# Patient Record
Sex: Female | Born: 1945 | Race: Black or African American | Hispanic: No | Smoking: Former smoker
Health system: Southern US, Community
[De-identification: ages and names within clinical notes are randomized; demographics above are authoritative.]

## PROBLEM LIST (undated history)

## (undated) DIAGNOSIS — H269 Unspecified cataract: Secondary | ICD-10-CM

## (undated) DIAGNOSIS — Z9189 Other specified personal risk factors, not elsewhere classified: Secondary | ICD-10-CM

## (undated) DIAGNOSIS — M1990 Unspecified osteoarthritis, unspecified site: Secondary | ICD-10-CM

## (undated) DIAGNOSIS — E785 Hyperlipidemia, unspecified: Secondary | ICD-10-CM

## (undated) HISTORY — DX: Hyperlipidemia, unspecified: E78.5

## (undated) HISTORY — DX: Unspecified osteoarthritis, unspecified site: M19.90

## (undated) HISTORY — DX: Unspecified cataract: H26.9

## (undated) HISTORY — DX: Other specified personal risk factors, not elsewhere classified: Z91.89

---

## 2008-08-23 HISTORY — PX: COLONOSCOPY: SHX174

## 2017-09-02 ENCOUNTER — Encounter: Payer: Self-pay | Admitting: Nurse Practitioner

## 2017-09-02 ENCOUNTER — Ambulatory Visit (INDEPENDENT_AMBULATORY_CARE_PROVIDER_SITE_OTHER): Payer: Medicare Other | Admitting: Nurse Practitioner

## 2017-09-02 VITALS — BP 160/96 | HR 55 | Temp 97.9°F | Ht 63.0 in | Wt 170.0 lb

## 2017-09-02 DIAGNOSIS — H9193 Unspecified hearing loss, bilateral: Secondary | ICD-10-CM

## 2017-09-02 DIAGNOSIS — R079 Chest pain, unspecified: Secondary | ICD-10-CM

## 2017-09-02 DIAGNOSIS — R030 Elevated blood-pressure reading, without diagnosis of hypertension: Secondary | ICD-10-CM | POA: Diagnosis not present

## 2017-09-02 DIAGNOSIS — R12 Heartburn: Secondary | ICD-10-CM | POA: Diagnosis not present

## 2017-09-02 LAB — CBC WITH DIFFERENTIAL/PLATELET
BASOS ABS: 0 10*3/uL (ref 0.0–0.1)
Basophils Relative: 0.4 % (ref 0.0–3.0)
Eosinophils Absolute: 0.1 10*3/uL (ref 0.0–0.7)
Eosinophils Relative: 0.9 % (ref 0.0–5.0)
HCT: 42.6 % (ref 36.0–46.0)
Hemoglobin: 13.3 g/dL (ref 12.0–15.0)
LYMPHS ABS: 2.2 10*3/uL (ref 0.7–4.0)
LYMPHS PCT: 35.1 % (ref 12.0–46.0)
MCHC: 31.3 g/dL (ref 30.0–36.0)
MCV: 87.6 fl (ref 78.0–100.0)
MONOS PCT: 8.4 % (ref 3.0–12.0)
Monocytes Absolute: 0.5 10*3/uL (ref 0.1–1.0)
NEUTROS PCT: 55.2 % (ref 43.0–77.0)
Neutro Abs: 3.5 10*3/uL (ref 1.4–7.7)
Platelets: 226 10*3/uL (ref 150.0–400.0)
RBC: 4.86 Mil/uL (ref 3.87–5.11)
RDW: 13.4 % (ref 11.5–15.5)
WBC: 6.3 10*3/uL (ref 4.0–10.5)

## 2017-09-02 LAB — LIPID PANEL
Cholesterol: 206 mg/dL — ABNORMAL HIGH (ref 0–200)
HDL: 53 mg/dL (ref >39.00)
LDL Cholesterol: 132 mg/dL — ABNORMAL HIGH (ref 0–99)
NONHDL: 152.83
Total CHOL/HDL Ratio: 4
Triglycerides: 103 mg/dL (ref 0.0–149.0)
VLDL: 20.6 mg/dL (ref 0.0–40.0)

## 2017-09-02 LAB — COMPREHENSIVE METABOLIC PANEL
ALK PHOS: 72 U/L (ref 39–117)
ALT: 13 U/L (ref 0–35)
AST: 15 U/L (ref 0–37)
Albumin: 4.4 g/dL (ref 3.5–5.2)
BILIRUBIN TOTAL: 0.5 mg/dL (ref 0.2–1.2)
BUN: 12 mg/dL (ref 6–23)
CO2: 29 mEq/L (ref 19–32)
Calcium: 9.5 mg/dL (ref 8.4–10.5)
Chloride: 102 mEq/L (ref 96–112)
Creatinine, Ser: 0.66 mg/dL (ref 0.40–1.20)
GFR: 113.14 mL/min (ref >60.00)
GLUCOSE: 91 mg/dL (ref 70–99)
Potassium: 4.5 mEq/L (ref 3.5–5.1)
Sodium: 140 mEq/L (ref 135–145)
TOTAL PROTEIN: 7.1 g/dL (ref 6.0–8.3)

## 2017-09-02 MED ORDER — FAMOTIDINE 20 MG PO TABS: 20.0000 mg | ORAL_TABLET | Freq: Two times a day (BID) | ORAL | Status: DC

## 2017-09-02 NOTE — Progress Notes (Signed)
Subjective:  Patient ID: Heather Winters, female    DOB: 10/09/1945  Age: 72 y.o. MRN: [id number]  CC: Chest Pain   Chest Pain   This is a chronic problem. The current episode started more than 1 month ago. The onset quality is gradual. The problem occurs intermittently. The problem has been waxing and waning. The pain is present in the substernal region. The quality of the pain is described as dull and pressure. The pain does not radiate. Pertinent negatives include no abdominal pain, cough, diaphoresis, dizziness, exertional chest pressure, fever, headaches, irregular heartbeat, leg pain, lower extremity edema, nausea, numbness, orthopnea, palpitations, shortness of breath or weakness. Risk factors include being elderly, sedentary lifestyle, smoking/tobacco exposure and post-menopausal.  Pertinent negatives for past medical history include no muscle weakness.  Gastroesophageal Reflux  She complains of chest pain and heartburn. She reports no abdominal pain, no choking, no coughing, no dysphagia, no globus sensation, no hoarse voice, no nausea, no sore throat, no stridor or no wheezing. This is a chronic problem. The current episode started more than 1 month ago. The problem occurs frequently. The problem has been unchanged. The heartburn is located in the substernum. The heartburn does not wake her from sleep. The heartburn does not limit her activity. The heartburn doesn't change with position. Nothing aggravates the symptoms. Pertinent negatives include no anemia, fatigue, melena, muscle weakness, orthopnea or weight loss. She has tried nothing for the symptoms.   No outpatient medications prior to visit.   No facility-administered medications prior to visit.    Family History  Problem Relation Age of Onset  . Diabetes Mother   . Diabetes Brother   . Diabetes Brother   . Diabetes Brother    Social History   Socioeconomic History  . Marital status: Widowed    Spouse name: Not on file  .  Number of children: Not on file  . Years of education: Not on file  . Highest education level: Not on file  Social Needs  . Financial resource strain: Not on file  . Food insecurity - worry: Not on file  . Food insecurity - inability: Not on file  . Transportation needs - medical: Not on file  . Transportation needs - non-medical: Not on file  Occupational History  . Not on file  Tobacco Use  . Smoking status: Former Research scientist (life sciences)  . Smokeless tobacco: Never Used  Substance and Sexual Activity  . Alcohol use: No    Frequency: Never  . Drug use: No  . Sexual activity: Not on file  Other Topics Concern  . Not on file  Social History Narrative  . Not on file   ROS See HPI  Objective:  BP (!) 160/96   Pulse (!) 55   Temp 97.9 F (36.6 C) (Oral)   Ht 5\' 3"  (1.6 m)   Wt 170 lb (77.1 kg)   SpO2 98%   BMI 30.11 kg/m   BP Readings from Last 3 Encounters:  09/02/17 (!) 160/96    Wt Readings from Last 3 Encounters:  09/02/17 170 lb (77.1 kg)    Physical Exam  Constitutional: She is oriented to person, place, and time. No distress.  Cardiovascular: Normal rate and regular rhythm.  Pulmonary/Chest: Effort normal and breath sounds normal.  Abdominal: Soft. Bowel sounds are normal.  Neurological: She is alert and oriented to person, place, and time.  Vitals reviewed.   Lab Results  Component Value Date   WBC 6.3 09/02/2017   HGB  13.3 09/02/2017   HCT 42.6 09/02/2017   PLT 226.0 09/02/2017   GLUCOSE 91 09/02/2017   CHOL 206 (H) 09/02/2017   TRIG 103.0 09/02/2017   HDL 53.00 09/02/2017   LDLCALC 132 (H) 09/02/2017   ALT 13 09/02/2017   AST 15 09/02/2017   NA 140 09/02/2017   K 4.5 09/02/2017   CL 102 09/02/2017   CREATININE 0.66 09/02/2017   BUN 12 09/02/2017   CO2 29 09/02/2017    ECG: sinus bradycardia, non specific T wave abnormality.  Assessment & Plan:   Okema was seen today for chest pain.  Diagnoses and all orders for this visit:  Chest pain,  unspecified type -     EKG 12-Lead -     CBC w/Diff -     Comprehensive metabolic panel -     Lipid panel  Bilateral hearing loss, unspecified hearing loss type -     Ambulatory referral to Audiology  Heartburn -     famotidine (PEPCID) 20 MG tablet; Take 1 tablet (20 mg total) by mouth 2 (two) times daily.  Elevated BP without diagnosis of hypertension -     EKG 12-Lead -     CBC w/Diff -     Comprehensive metabolic panel -     Lipid panel   I am having Chanay Traughber start on famotidine.  Meds ordered this encounter  Medications  . famotidine (PEPCID) 20 MG tablet    Sig: Take 1 tablet (20 mg total) by mouth 2 (two) times daily.    Order Specific Question:   Supervising Provider    Answer:   Lucille Passy [[id number]]    Follow-up: Return in about 2 weeks (around 09/16/2017) for GERD and elevated BP.  Wilfred Lacy, NP

## 2017-09-02 NOTE — Patient Instructions (Addendum)
No acute finding on ECG.  Check BP at home three times a week and record.  You will be called to make appt with audiology.  Stable labs F/up in 2week as discussed. Entered referral to cardiology.  If you have medicare related insurance (such as traditional Medicare, Blue [employer], [employer], or similar), Please make an appointment at the scheduling desk with the [facility], for your Wellness visit in this office, which is a benefit with your insurance.  Need to sign medical release to get records from previous pcp in [place].   Food Choices for Gastroesophageal Reflux Disease, Adult When you have gastroesophageal reflux disease (GERD), the foods you eat and your eating habits are very important. Choosing the right foods can help ease your discomfort. What guidelines do I need to follow?  Choose fruits, vegetables, whole grains, and low-fat dairy products.  Choose low-fat meat, fish, and poultry.  Limit fats such as oils, salad dressings, butter, nuts, and avocado.  Keep a food diary. This helps you identify foods that cause symptoms.  Avoid foods that cause symptoms. These may be different for everyone.  Eat small meals often instead of 3 large meals a day.  Eat your meals slowly, in a place where you are relaxed.  Limit fried foods.  Cook foods using methods other than frying.  Avoid drinking alcohol.  Avoid drinking large amounts of liquids with your meals.  Avoid bending over or lying down until 2-3 hours after eating. What foods are not recommended? These are some foods and drinks that may make your symptoms worse: Vegetables Tomatoes. Tomato juice. Tomato and spaghetti sauce. Chili peppers. Onion and garlic. Horseradish. Fruits Oranges, grapefruit, and lemon (fruit and juice). Meats High-fat meats, fish, and poultry. This includes hot dogs, ribs, ham, sausage, salami, and bacon. Dairy Whole milk and chocolate milk. Sour cream.  Cream. Butter. Ice cream. Cream cheese. Drinks Coffee and tea. Bubbly (carbonated) drinks or energy drinks. Condiments Hot sauce. Barbecue sauce. Sweets/Desserts Chocolate and cocoa. Donuts. Peppermint and spearmint. Fats and Oils High-fat foods. This includes [place] fries and potato chips. Other Vinegar. Strong spices. This includes black pepper, white pepper, red pepper, cayenne, curry powder, cloves, ginger, and chili powder. The items listed above may not be a complete list of foods and drinks to avoid. Contact your dietitian for more information. This information is not intended to replace advice given to you by your health care provider. Make sure you discuss any questions you have with your health care provider. Document Released: 02/08/2012 Document Revised: 01/15/2016 Document Reviewed: 06/13/2013 Elsevier Interactive Patient Education  2017 [employer].

## 2017-09-16 ENCOUNTER — Encounter: Payer: Self-pay | Admitting: Nurse Practitioner

## 2017-09-16 ENCOUNTER — Ambulatory Visit (INDEPENDENT_AMBULATORY_CARE_PROVIDER_SITE_OTHER): Payer: Medicare Other | Admitting: Nurse Practitioner

## 2017-09-16 VITALS — BP 130/84 | HR 60 | Temp 97.8°F | Ht 63.0 in | Wt 170.0 lb

## 2017-09-16 DIAGNOSIS — R030 Elevated blood-pressure reading, without diagnosis of hypertension: Secondary | ICD-10-CM

## 2017-09-16 DIAGNOSIS — K219 Gastro-esophageal reflux disease without esophagitis: Secondary | ICD-10-CM

## 2017-09-16 HISTORY — DX: Elevated blood-pressure reading, without diagnosis of hypertension: R03.0

## 2017-09-16 MED ORDER — FAMOTIDINE 20 MG PO TABS: 20.0000 mg | ORAL_TABLET | Freq: Every day | ORAL | Status: DC | PRN

## 2017-09-16 NOTE — Patient Instructions (Addendum)
Continue check BP once or twice a week. Return to office if BP is persistently >150/80.  Mainatain upcoming appt with cardiology 10/14/2017.  If you have medicare related insurance (such as traditional Medicare, [employer], [employer], or similar), Please make an appointment at the scheduling desk with the [facility], for your Wellness visit in this office, which is a benefit with your insurance.  DASH Eating Plan DASH stands for "Dietary Approaches to Stop Hypertension." The DASH eating plan is a healthy eating plan that has been shown to reduce high blood pressure (hypertension). It may also reduce your risk for type 2 diabetes, heart disease, and stroke. The DASH eating plan may also help with weight loss. What are tips for following this plan? General guidelines  Avoid eating more than 2,300 mg (milligrams) of salt (sodium) a day. If you have hypertension, you may need to reduce your sodium intake to 1,500 mg a day.  Limit alcohol intake to no more than 1 drink a day for nonpregnant women and 2 drinks a day for men. One drink equals 12 oz of beer, 5 oz of wine, or 1 oz of hard liquor.  Work with your health care provider to maintain a healthy body weight or to lose weight. Ask what an ideal weight is for you.  Get at least 30 minutes of exercise that causes your heart to beat faster (aerobic exercise) most days of the week. Activities may include walking, swimming, or biking.  Work with your health care provider or diet and nutrition specialist (dietitian) to adjust your eating plan to your individual calorie needs. Reading food labels  Check food labels for the amount of sodium per serving. Choose foods with less than 5 percent of the Daily Value of sodium. Generally, foods with less than 300 mg of sodium per serving fit into this eating plan.  To find whole grains, look for the word "whole" as the first word in the ingredient list. Shopping  Buy  products labeled as "low-sodium" or "no salt added."  Buy fresh foods. Avoid canned foods and premade or frozen meals. Cooking  Avoid adding salt when cooking. Use salt-free seasonings or herbs instead of table salt or sea salt. Check with your health care provider or pharmacist before using salt substitutes.  Do not fry foods. Cook foods using healthy methods such as baking, boiling, grilling, and broiling instead.  Cook with heart-healthy oils, such as olive, canola, soybean, or sunflower oil. Meal planning   Eat a balanced diet that includes: ? 5 or more servings of fruits and vegetables each day. At each meal, try to fill half of your plate with fruits and vegetables. ? Up to 6-8 servings of whole grains each day. ? Less than 6 oz of lean meat, poultry, or fish each day. A 3-oz serving of meat is about the same size as a deck of cards. One egg equals 1 oz. ? 2 servings of low-fat dairy each day. ? A serving of nuts, seeds, or beans 5 times each week. ? Heart-healthy fats. Healthy fats called Omega-3 fatty acids are found in foods such as flaxseeds and coldwater fish, like sardines, salmon, and mackerel.  Limit how much you eat of the following: ? Canned or prepackaged foods. ? Food that is high in trans fat, such as fried foods. ? Food that is high in saturated fat, such as fatty meat. ? Sweets, desserts, sugary drinks, and other foods with added sugar. ? Full-fat dairy  products.  Do not salt foods before eating.  Try to eat at least 2 vegetarian meals each week.  Eat more home-cooked food and less restaurant, buffet, and fast food.  When eating at a restaurant, ask that your food be prepared with less salt or no salt, if possible. What foods are recommended? The items listed may not be a complete list. Talk with your dietitian about what dietary choices are best for you. Grains Whole-grain or whole-wheat bread. Whole-grain or whole-wheat pasta. Brown rice. [name].  Bulgur. Whole-grain and low-sodium cereals. Pita bread. Low-fat, low-sodium crackers. Whole-wheat flour tortillas. Vegetables Fresh or frozen vegetables (raw, steamed, roasted, or grilled). Low-sodium or reduced-sodium tomato and vegetable juice. Low-sodium or reduced-sodium tomato sauce and tomato paste. Low-sodium or reduced-sodium canned vegetables. Fruits All fresh, dried, or frozen fruit. Canned fruit in natural juice (without added sugar). Meat and other protein foods Skinless chicken or [place]. Ground chicken or [place]. Pork with fat trimmed off. Fish and seafood. Egg whites. Dried beans, peas, or lentils. Unsalted nuts, nut butters, and seeds. Unsalted canned beans. Lean cuts of beef with fat trimmed off. Low-sodium, lean deli meat. Dairy Low-fat (1%) or fat-free (skim) milk. Fat-free, low-fat, or reduced-fat cheeses. Nonfat, low-sodium ricotta or cottage cheese. Low-fat or nonfat yogurt. Low-fat, low-sodium cheese. Fats and oils Soft margarine without trans fats. Vegetable oil. Low-fat, reduced-fat, or light mayonnaise and salad dressings (reduced-sodium). Canola, safflower, olive, soybean, and sunflower oils. Avocado. Seasoning and other foods Herbs. Spices. Seasoning mixes without salt. Unsalted popcorn and pretzels. Fat-free sweets. What foods are not recommended? The items listed may not be a complete list. Talk with your dietitian about what dietary choices are best for you. Grains Baked goods made with fat, such as croissants, muffins, or some breads. Dry pasta or rice meal packs. Vegetables Creamed or fried vegetables. Vegetables in a cheese sauce. Regular canned vegetables (not low-sodium or reduced-sodium). Regular canned tomato sauce and paste (not low-sodium or reduced-sodium). Regular tomato and vegetable juice (not low-sodium or reduced-sodium). Heather Winters. Olives. Fruits Canned fruit in a light or heavy syrup. Fried fruit. Fruit in cream or butter sauce. Meat and other  protein foods Fatty cuts of meat. Ribs. Fried meat. Heather Winters. Sausage. Bologna and other processed lunch meats. Salami. Fatback. Hotdogs. Bratwurst. Salted nuts and seeds. Canned beans with added salt. Canned or smoked fish. Whole eggs or egg yolks. Chicken or [place] with skin. Dairy Whole or 2% milk, cream, and half-and-half. Whole or full-fat cream cheese. Whole-fat or sweetened yogurt. Full-fat cheese. Nondairy creamers. Whipped toppings. Processed cheese and cheese spreads. Fats and oils Butter. Stick margarine. Lard. Shortening. Ghee. Bacon fat. Tropical oils, such as coconut, palm kernel, or palm oil. Seasoning and other foods Salted popcorn and pretzels. Onion salt, garlic salt, seasoned salt, table salt, and sea salt. Worcestershire sauce. Tartar sauce. Barbecue sauce. Teriyaki sauce. Soy sauce, including reduced-sodium. Steak sauce. Canned and packaged gravies. Fish sauce. Oyster sauce. Cocktail sauce. Horseradish that you find on the shelf. Ketchup. Mustard. Meat flavorings and tenderizers. Bouillon cubes. Hot sauce and Tabasco sauce. Premade or packaged marinades. Premade or packaged taco seasonings. Relishes. Regular salad dressings. Where to find more information:  National Heart, Lung, and [place]: https://wilson-eaton.com/  American Heart Association: www.heart.org Summary  The DASH eating plan is a healthy eating plan that has been shown to reduce high blood pressure (hypertension). It may also reduce your risk for type 2 diabetes, heart disease, and stroke.  With the DASH eating plan, you should limit  salt (sodium) intake to 2,300 mg a day. If you have hypertension, you may need to reduce your sodium intake to 1,500 mg a day.  When on the DASH eating plan, aim to eat more fresh fruits and vegetables, whole grains, lean proteins, low-fat dairy, and heart-healthy fats.  Work with your health care provider or diet and nutrition specialist (dietitian) to adjust your eating plan to your  individual calorie needs. This information is not intended to replace advice given to you by your health care provider. Make sure you discuss any questions you have with your health care provider. Document Released: 07/29/2011 Document Revised: 08/02/2016 Document Reviewed: 08/02/2016 Elsevier Interactive Patient Education  [employer].

## 2017-09-16 NOTE — Progress Notes (Signed)
   Subjective:  Patient ID: Heather Winters, female    DOB: 03/06/1946  Age: 72 y.o. MRN: [id number]  CC: Follow-up (2 wk GERD and BP--GERD is getting better)   HPI  GERD: Improved heartburn and indigestion. Use of pepcid prn at this time. Has not made changes to diet.  Elevated BP: Home readings of 130s/80s. Has persistent intermittent CP. Not related to exertion.  Outpatient Medications Prior to Visit  Medication Sig Dispense Refill  . famotidine (PEPCID) 20 MG tablet Take 1 tablet (20 mg total) by mouth 2 (two) times daily.     No facility-administered medications prior to visit.     ROS See HPI  Objective:  BP 130/84   Pulse 60   Temp 97.8 F (36.6 C)   Ht 5\' 3"  (1.6 m)   Wt 170 lb (77.1 kg)   SpO2 98%   BMI 30.11 kg/m   BP Readings from Last 3 Encounters:  09/16/17 130/84  09/02/17 (!) 160/96    Wt Readings from Last 3 Encounters:  09/16/17 170 lb (77.1 kg)  09/02/17 170 lb (77.1 kg)    Physical Exam  Constitutional: She is oriented to person, place, and time. No distress.  Cardiovascular: Normal rate and regular rhythm.  Pulmonary/Chest: Effort normal and breath sounds normal. She exhibits no tenderness.  Abdominal: Soft. Bowel sounds are normal.  Musculoskeletal: She exhibits no edema.  Neurological: She is alert and oriented to person, place, and time.  Vitals reviewed.   Lab Results  Component Value Date   WBC 6.3 09/02/2017   HGB 13.3 09/02/2017   HCT 42.6 09/02/2017   PLT 226.0 09/02/2017   GLUCOSE 91 09/02/2017   CHOL 206 (H) 09/02/2017   TRIG 103.0 09/02/2017   HDL 53.00 09/02/2017   LDLCALC 132 (H) 09/02/2017   ALT 13 09/02/2017   AST 15 09/02/2017   NA 140 09/02/2017   K 4.5 09/02/2017   CL 102 09/02/2017   CREATININE 0.66 09/02/2017   BUN 12 09/02/2017   CO2 29 09/02/2017     Assessment & Plan:   Heather Winters was seen today for follow-up.  Diagnoses and all orders for this visit:  Gastroesophageal reflux disease without  esophagitis -     famotidine (PEPCID) 20 MG tablet; Take 1 tablet (20 mg total) by mouth daily as needed for heartburn or indigestion.  Elevated BP without diagnosis of hypertension   I have changed Heather Winters's famotidine.  Meds ordered this encounter  Medications  . famotidine (PEPCID) 20 MG tablet    Sig: Take 1 tablet (20 mg total) by mouth daily as needed for heartburn or indigestion.    Order Specific Question:   Supervising Provider    Answer:   Lucille Passy [[id number]]    Follow-up: Return in about 1 year (around 09/16/2018) for elevated BP and GERD.Marland Kitchen  Wilfred Lacy, NP

## 2017-09-26 DIAGNOSIS — H903 Sensorineural hearing loss, bilateral: Secondary | ICD-10-CM | POA: Diagnosis not present

## 2017-10-11 ENCOUNTER — Encounter: Payer: Self-pay | Admitting: Nurse Practitioner

## 2017-10-11 DIAGNOSIS — H903 Sensorineural hearing loss, bilateral: Secondary | ICD-10-CM | POA: Insufficient documentation

## 2017-10-13 NOTE — Progress Notes (Signed)
New Outpatient Visit Date: 10/14/2017  Referring Provider: Flossie Buffy, NP [place], [place] [address]  Chief Complaint: Chest discomfort  HPI:  Heather Winters is a 72 y.o. female who is being seen today for the evaluation of chest pain at the request of Heather Winters. She has a history of arthritis.  She was evaluated twice by her PCP last month due to chest pain and heartburn.  She was started on famotidine 20 mg BID with improvement in heartburn.  However, she continued to have intermittent chest pain, per her PCP.  Today, Heather Winters reports feeling relatively well. She initially became "consciousness" of her heart a few years ago, feeling it beating in her chest. It was not racing or irregular. Beginning a few months ago, she started having a "funny feeling" in her chest. On one occasion, she had a burning and fullness in the center of her chest that lasted 1.5 days. Since starting famotidine, this has resolved. There were no associated symptoms other than chronic dizziness. Heather Winters is unable to characterize the severity of the discomfort, nor does she report any exacerbating factors. She has occasional dependent edema one seated for long periods of time. She denies orthopnea and PND.  Heather Winters reports an episode in the 71s during which her heart began racing. She was evaluated in some sort of a medical facility and received an IV medication with prompt termination of the palpitations. Up until a few years ago, she still had episodes of fluttering in her chest. This Winters stopped smoking in January and feels as though her chest discomfort has improved.  --------------------------------------------------------------------------------------------------  Cardiovascular History & Procedures: Cardiovascular Problems:  Chest pain  Risk Factors:  Tobacco use, age > 4, and obesity  Cath/PCI:  None  CV Surgery:  None  EP Procedures and  Devices:  None  Non-Invasive Evaluation(s):  None  Recent CV Pertinent Labs: Lab Results  Component Value Date   CHOL 206 (H) 09/02/2017   HDL 53.00 09/02/2017   LDLCALC 132 (H) 09/02/2017   TRIG 103.0 09/02/2017   CHOLHDL 4 09/02/2017   K 4.5 09/02/2017   BUN 12 09/02/2017   CREATININE 0.66 09/02/2017    --------------------------------------------------------------------------------------------------  Past Medical History:  Diagnosis Date  . Arthritis   . History of fainting spells of unknown cause     History reviewed. No pertinent surgical history.  Current Meds  Medication Sig  . famotidine (PEPCID) 20 MG tablet Take 1 tablet (20 mg total) by mouth daily as needed for heartburn or indigestion.    Allergies: Patient has no known allergies.  Social History   Socioeconomic History  . Marital status: Widowed    Spouse name: Not on file  . Number of children: Not on file  . Years of education: Not on file  . Highest education level: Not on file  Social Needs  . Financial resource strain: Not on file  . Food insecurity - worry: Not on file  . Food insecurity - inability: Not on file  . Transportation needs - medical: Not on file  . Transportation needs - non-medical: Not on file  Occupational History  . Not on file  Tobacco Use  . Smoking status: Former Research scientist (life sciences)  . Smokeless tobacco: Never Used  Substance and Sexual Activity  . Alcohol use: No    Frequency: Never  . Drug use: No  . Sexual activity: Not on file  Other Topics Concern  . Not on file  Social  History Narrative  . Not on file    Family History  Problem Relation Age of Onset  . Diabetes Mother   . Diabetes Brother   . Diabetes Brother   . Diabetes Brother     Review of Systems: Review of Systems  Constitutional: Positive for diaphoresis.  HENT: Negative.   Eyes: Negative.   Respiratory: Negative.   Cardiovascular: Positive for chest pain and leg swelling.  Gastrointestinal:  Negative.   Genitourinary: Negative.   Musculoskeletal: Positive for myalgias.  Skin: Negative.   Neurological: Positive for dizziness.  Endo/Heme/Allergies: Negative.   Psychiatric/Behavioral: Positive for depression (Question). The patient is nervous/anxious (Question).     --------------------------------------------------------------------------------------------------  Physical Exam: BP 138/70   Pulse (!) 58   Ht 5\' 3"  (1.6 m)   Wt 168 lb 1.9 oz (76.3 kg)   SpO2 99%   BMI 29.78 kg/m   General:  Overweight woman, seated comfortably in the exam room. HEENT: No conjunctival pallor or scleral icterus. Moist mucous membranes. OP clear. Neck: Supple without lymphadenopathy, thyromegaly, JVD, or HJR. No carotid bruit. Lungs: Normal work of breathing. Clear to auscultation bilaterally without wheezes or crackles. Heart: Regular rate and rhythm with 2/6 systolic murmur loudest at the right upper sternal border. No rubs or gallops. Nondisplaced PMI.  Abd: Bowel sounds present. Soft, NT/ND without hepatosplenomegaly Ext: No lower extremity edema. Radial, PT, and DP pulses are 2+ bilaterally Skin: Warm and dry without rash. Neuro: CNIII-XII intact. Strength and fine-touch sensation intact in upper and lower extremities bilaterally. Psych: Normal mood and affect.  EKG:  Sinus bradycardia (heart rate 58 bpm), right atrial enlargement, poor R-wave progression in V1 and V2, and inferolateral T-wave inversions.  Lab Results  Component Value Date   WBC 6.3 09/02/2017   HGB 13.3 09/02/2017   HCT 42.6 09/02/2017   MCV 87.6 09/02/2017   PLT 226.0 09/02/2017    Lab Results  Component Value Date   NA 140 09/02/2017   K 4.5 09/02/2017   CL 102 09/02/2017   CO2 29 09/02/2017   BUN 12 09/02/2017   CREATININE 0.66 09/02/2017   GLUCOSE 91 09/02/2017   ALT 13 09/02/2017    Lab Results  Component Value Date   CHOL 206 (H) 09/02/2017   HDL 53.00 09/02/2017   LDLCALC 132 (H) 09/02/2017    TRIG 103.0 09/02/2017   CHOLHDL 4 09/02/2017    --------------------------------------------------------------------------------------------------  ASSESSMENT AND PLAN: Atypical chest pain and abnormal EKG Chest pain is most suggestive of GI pathology, including GERD. However, EKG is abnormal with poor R-wave progression and anterolateral T-wave inversions. Given that Heather Winters has been asymptomatic for over a month now, I do not feel that urgent evaluation is needed. Cardiac risk factors include age, obsity, and history of tobacco use. I recommended that we begin aspirin 81 mg daily and obtain an exercise myocardial perfusion stress test. If pain recurs, I have advised her to seek immediate medical attention.  Heart murmur Incidentally noted on exam today. No signs or symptoms of heart failure. We will obtain an echocardiogram for further assessment.  GERD Continue famotidine.  Follow-up: Return to clinic in 4-6 weeks.  Nelva Bush, MD 10/14/2017 10:02 AM

## 2017-10-14 ENCOUNTER — Ambulatory Visit (INDEPENDENT_AMBULATORY_CARE_PROVIDER_SITE_OTHER): Payer: Medicare Other | Admitting: Internal Medicine

## 2017-10-14 ENCOUNTER — Encounter: Payer: Self-pay | Admitting: Internal Medicine

## 2017-10-14 VITALS — BP 138/70 | HR 58 | Ht 63.0 in | Wt 168.1 lb

## 2017-10-14 DIAGNOSIS — R0789 Other chest pain: Secondary | ICD-10-CM | POA: Diagnosis not present

## 2017-10-14 DIAGNOSIS — R9431 Abnormal electrocardiogram [ECG] [EKG]: Secondary | ICD-10-CM

## 2017-10-14 DIAGNOSIS — R011 Cardiac murmur, unspecified: Secondary | ICD-10-CM | POA: Insufficient documentation

## 2017-10-14 HISTORY — DX: Abnormal electrocardiogram (ECG) (EKG): R94.31

## 2017-10-14 HISTORY — DX: Other chest pain: R07.89

## 2017-10-14 NOTE — Patient Instructions (Addendum)
Medication Instructions:   START Aspirin by mouth 81 mg  -- If you need a refill on your cardiac medications before your next appointment, please call your pharmacy. --  Labwork: None ordered  Testing/Procedures: Your physician has requested that you have en exercise stress myoview. For further information please visit [email or web address]. Please follow instruction sheet, as given.  Your physician has requested that you have an echocardiogram. Echocardiography is a painless test that uses sound waves to create images of your heart. It provides your doctor with information about the size and shape of your heart and how well your heart's chambers and valves are working. This procedure takes approximately one hour. There are no restrictions for this procedure.   Schedule next available  Follow-Up: Your physician wants you to follow-up in: 4-6 weeks with Dr. Saunders Revel or APP  You will receive a reminder letter in the mail two months in advance. If you don't receive a letter, please call our office to schedule the follow-up appointment.  Thank you for choosing [facility]!!    Any Other Special Instructions Will Be Listed Below (If Applicable).

## 2017-10-20 ENCOUNTER — Telehealth (HOSPITAL_COMMUNITY): Payer: Self-pay | Admitting: *Deleted

## 2017-10-20 NOTE — Telephone Encounter (Signed)
Patient given detailed instructions per Myocardial Perfusion Study Information Sheet for the test on 10/25/17. Patient notified to arrive 15 minutes early and that it is imperative to arrive on time for appointment to keep from having the test rescheduled.  If you need to cancel or reschedule your appointment, please call the office within 24 hours of your appointment. . Patient verbalized understanding.Heather Winters, Heather Winters    

## 2017-10-25 ENCOUNTER — Other Ambulatory Visit: Payer: Self-pay

## 2017-10-25 ENCOUNTER — Ambulatory Visit (HOSPITAL_COMMUNITY): Payer: Medicare Other | Attending: Cardiology

## 2017-10-25 ENCOUNTER — Ambulatory Visit (HOSPITAL_BASED_OUTPATIENT_CLINIC_OR_DEPARTMENT_OTHER): Payer: Medicare Other

## 2017-10-25 DIAGNOSIS — Z87891 Personal history of nicotine dependence: Secondary | ICD-10-CM | POA: Diagnosis not present

## 2017-10-25 DIAGNOSIS — R011 Cardiac murmur, unspecified: Secondary | ICD-10-CM | POA: Diagnosis not present

## 2017-10-25 DIAGNOSIS — I340 Nonrheumatic mitral (valve) insufficiency: Secondary | ICD-10-CM | POA: Insufficient documentation

## 2017-10-25 DIAGNOSIS — R0789 Other chest pain: Secondary | ICD-10-CM | POA: Insufficient documentation

## 2017-10-25 DIAGNOSIS — R002 Palpitations: Secondary | ICD-10-CM | POA: Diagnosis not present

## 2017-10-25 DIAGNOSIS — I2510 Atherosclerotic heart disease of native coronary artery without angina pectoris: Secondary | ICD-10-CM | POA: Diagnosis not present

## 2017-10-25 DIAGNOSIS — I517 Cardiomegaly: Secondary | ICD-10-CM | POA: Insufficient documentation

## 2017-10-25 DIAGNOSIS — R9431 Abnormal electrocardiogram [ECG] [EKG]: Secondary | ICD-10-CM | POA: Diagnosis not present

## 2017-10-25 MED ORDER — TECHNETIUM TC 99M TETROFOSMIN IV KIT
32.6000 | PACK | Freq: Once | INTRAVENOUS | Status: AC | PRN
  Administered 2017-10-25: 32.6 via INTRAVENOUS
  Filled 2017-10-25: qty 33

## 2017-10-25 MED ORDER — TECHNETIUM TC 99M TETROFOSMIN IV KIT
10.2000 | PACK | Freq: Once | INTRAVENOUS | Status: AC | PRN
  Administered 2017-10-25: 10.2 via INTRAVENOUS
  Filled 2017-10-25: qty 11

## 2017-10-26 LAB — MYOCARDIAL PERFUSION IMAGING
CHL CUP MPHR: 148 {beats}/min
CHL CUP NUCLEAR SDS: 2
CHL CUP NUCLEAR SRS: 6
Estimated workload: 7 METS
Exercise duration (min): 6 min
LV dias vol: 62 mL (ref 46–106)
LVSYSVOL: 20 mL
NUC STRESS TID: 1.11
Peak HR: 130 {beats}/min
Percent HR: 88 %
RATE: 0.21
RPE: 19
Rest HR: 53 {beats}/min
SSS: 5

## 2017-11-04 ENCOUNTER — Encounter: Payer: Self-pay | Admitting: *Deleted

## 2017-11-14 ENCOUNTER — Ambulatory Visit: Payer: Medicare Other | Admitting: Internal Medicine

## 2018-02-20 NOTE — Progress Notes (Signed)
Subjective:   Heather Winters is a 72 y.o. female who presents for an Initial Medicare Annual Wellness Visit.  Review of Systems   No ROS.  Medicare Wellness Visit. Additional risk factors are reflected in the social history.    Sleep patterns: Sleep patterns vary. Home Safety/Smoke Alarms: Feels safe in home. Smoke alarms in place.  Living environment; residence and Firearm Safety: Lives in apt.  Female:       Mammo- ordered      Dexa scan-  ordered      [facility]- pt will check on date of last screening.     Objective:    Today's Vitals   02/22/18 0848  BP: 132/84  Pulse: (!) 54  SpO2: 98%  Weight: 168 lb 6.4 oz (76.4 kg)  Height: 5\' 3"  (1.6 m)   Body mass index is 29.83 kg/m.  Advanced Directives 02/22/2018  Does Patient Have a Medical Advance Directive? No  Would patient like information on creating a medical advance directive? No - Patient declined    Current Medications (verified) Outpatient Encounter Medications as of 02/22/2018  Medication Sig  . famotidine (PEPCID) 20 MG tablet Take 1 tablet (20 mg total) by mouth daily as needed for heartburn or indigestion.   No facility-administered encounter medications on file as of 02/22/2018.     Allergies (verified) Patient has no known allergies.   History: Past Medical History:  Diagnosis Date  . Arthritis   . History of fainting spells of unknown cause    History reviewed. No pertinent surgical history. Family History  Problem Relation Age of Onset  . Diabetes Mother   . Diabetes Brother   . Diabetes Brother   . Diabetes Brother   . COPD Father   . Heart disease Neg Hx    Social History   Socioeconomic History  . Marital status: Widowed    Spouse name: Not on file  . Number of children: Not on file  . Years of education: Not on file  . Highest education level: Not on file  Occupational History  . Not on file  Social Needs  . Financial resource strain: Not on file  . Food insecurity:    Worry: Not on  file    Inability: Not on file  . Transportation needs:    Medical: Not on file    Non-medical: Not on file  Tobacco Use  . Smoking status: Former Smoker    Packs/day: 0.50    Years: 35.00    Pack years: 17.50    Types: Cigarettes  . Smokeless tobacco: Never Used  Substance and Sexual Activity  . Alcohol use: No    Frequency: Never  . Drug use: No  . Sexual activity: Not Currently  Lifestyle  . Physical activity:    Days per week: Not on file    Minutes per session: Not on file  . Stress: Not on file  Relationships  . Social connections:    Talks on phone: Not on file    Gets together: Not on file    Attends religious service: Not on file    Active member of club or organization: Not on file    Attends meetings of clubs or organizations: Not on file    Relationship status: Not on file  Other Topics Concern  . Not on file  Social History Narrative  . Not on file    Tobacco Counseling Counseling given: Not Answered   Clinical Intake: Pain : No/denies pain  Activities of Daily Living In your present state of health, do you have any difficulty performing the following activities: 02/22/2018  Hearing? Y  Comment pt reports hearing test earlier this year. was told hearing aids not needed at this time.  Vision? N  Difficulty concentrating or making decisions? N  Walking or climbing stairs? N  Dressing or bathing? N  Doing errands, shopping? N  Preparing Food and eating ? N  Using the Toilet? N  In the past six months, have you accidently leaked urine? N  Do you have problems with loss of bowel control? N  Managing your Medications? N  Managing your Finances? N  Housekeeping or managing your Housekeeping? N     Immunizations and Health Maintenance  There is no immunization history on file for this patient. Health Maintenance Due  Topic Date Due  . Hepatitis C Screening  09/27/1945  . TETANUS/TDAP  06/02/1964  . MAMMOGRAM  06/03/1995  . COLONOSCOPY   06/03/1995  . DEXA SCAN  06/02/2010  . PNA vac Low Risk Adult (1 of 2 - PCV13) 06/02/2010    Patient Care Team: Nche, Charlene Brooke, NP as PCP - General (Internal Medicine) End, Harrell Gave, MD as PCP - Cardiology (Cardiology)  Indicate any recent Medical Services you may have received from other than [facility] providers in the past year (date may be approximate).     Assessment:   This is a routine wellness examination for [facility]. Physical assessment deferred to PCP.  Hearing/Vision screen No exam data present  Dietary issues and exercise activities discussed: Current Exercise Habits: The patient does not participate in regular exercise at present, Exercise limited by: None identified Diet (meal preparation, eat out, water intake, caffeinated beverages, dairy products, fruits and vegetables): 24 hr recall Breakfast:cereal Lunch: skips Dinner:   Catfish and coleslaw Pt reports she will start drinking more water.  Goals    . Increase physical activity      Depression Screen PHQ 2/9 Scores 02/22/2018 09/02/2017  PHQ - 2 Score 1 6    Fall Risk Fall Risk  02/22/2018 09/02/2017  Falls in the past year? No No    Cognitive Function: Ad8 score reviewed for issues:  Issues making decisions:no  Less interest in hobbies / activities:no  Repeats questions, stories (family complaining):no  Trouble using ordinary gadgets (microwave, computer, phone):no  Forgets the month or year: no  Mismanaging finances: no  Remembering appts:no  Daily problems with thinking and/or memory:no Ad8 score is=0        Screening Tests Health Maintenance  Topic Date Due  . Hepatitis C Screening  1946-05-06  . TETANUS/TDAP  06/02/1964  . MAMMOGRAM  06/03/1995  . COLONOSCOPY  06/03/1995  . DEXA SCAN  06/02/2010  . PNA vac Low Risk Adult (1 of 2 - PCV13) 06/02/2010  . INFLUENZA VACCINE  05/05/2018 (Originally 03/23/2018)    Plan:    Please schedule your next medicare wellness visit with me in 1  yr.  Continue to eat heart healthy diet (full of fruits, vegetables, whole grains, lean protein, water--limit salt, fat, and sugar intake) and increase physical activity as tolerated.  Continue doing brain stimulating activities (puzzles, reading, adult coloring books, staying active) to keep memory sharp.   I have ordered your mammogram and bone density scan. Please schedule.  Please notify us of colonoscopy report/ date.  I have personally reviewed and noted the following in the patient's chart:   . Medical and social history . Use of alcohol, tobacco or  illicit drugs  . Current medications and supplements . Functional ability and status . Nutritional status . Physical activity . Advanced directives . List of other physicians . Hospitalizations, surgeries, and ER visits in previous 12 months . Vitals . Screenings to include cognitive, depression, and falls . Referrals and appointments  In addition, I have reviewed and discussed with patient certain preventive protocols, quality metrics, and best practice recommendations. A written personalized care plan for preventive services as well as general preventive health recommendations were provided to patient.     Shela Nevin, [place]   02/22/2018

## 2018-02-22 ENCOUNTER — Ambulatory Visit (INDEPENDENT_AMBULATORY_CARE_PROVIDER_SITE_OTHER): Payer: Medicare Other | Admitting: Behavioral Health

## 2018-02-22 ENCOUNTER — Encounter: Payer: Self-pay | Admitting: Behavioral Health

## 2018-02-22 VITALS — BP 132/84 | HR 54 | Ht 63.0 in | Wt 168.4 lb

## 2018-02-22 DIAGNOSIS — Z0000 Encounter for general adult medical examination without abnormal findings: Secondary | ICD-10-CM

## 2018-02-22 DIAGNOSIS — Z1239 Encounter for other screening for malignant neoplasm of breast: Secondary | ICD-10-CM

## 2018-02-22 DIAGNOSIS — Z780 Asymptomatic menopausal state: Secondary | ICD-10-CM

## 2018-02-22 NOTE — Patient Instructions (Addendum)
Please schedule your next medicare wellness visit with me in 1 yr.  Continue to eat heart healthy diet (full of fruits, vegetables, whole grains, lean protein, water--limit salt, fat, and sugar intake) and increase physical activity as tolerated.  Continue doing brain stimulating activities (puzzles, reading, adult coloring books, staying active) to keep memory sharp.   I have ordered your mammogram and bone density scan. Please schedule.  Please notify us of colonoscopy report/ date.  Heather Winters , Thank you for taking time to come for your Medicare Wellness Visit. I appreciate your ongoing commitment to your health goals. Please review the following plan we discussed and let me know if I can assist you in the future.   These are the goals we discussed: Goals    . Increase physical activity       This is a list of the screening recommended for you and due dates:  Health Maintenance  Topic Date Due  .  Hepatitis C: One time screening is recommended by Center for Disease Control  (CDC) for  adults born from [birth year] through 1966.   1945-09-10  . Tetanus Vaccine  06/02/1964  . Mammogram  06/03/1995  . Colon Cancer Screening  06/03/1995  . DEXA scan (bone density measurement)  06/02/2010  . Pneumonia vaccines (1 of 2 - PCV13) 06/02/2010  . Flu Shot  05/05/2018*  *Topic was postponed. The date shown is not the original due date.    Health Maintenance for Postmenopausal Women Menopause is a normal process in which your reproductive ability comes to an end. This process happens gradually over a span of months to years, usually between the ages of 60 and 85. Menopause is complete when you have missed 12 consecutive menstrual periods. It is important to talk with your health care provider about some of the most common conditions that affect postmenopausal women, such as heart disease, cancer, and bone loss (osteoporosis). Adopting a healthy lifestyle and getting preventive care can help to  promote your health and wellness. Those actions can also lower your chances of developing some of these common conditions. What should I know about menopause? During menopause, you may experience a number of symptoms, such as:  Moderate-to-severe hot flashes.  Night sweats.  Decrease in sex drive.  Mood swings.  Headaches.  Tiredness.  Irritability.  Memory problems.  Insomnia.  Choosing to treat or not to treat menopausal changes is an individual decision that you make with your health care provider. What should I know about hormone replacement therapy and supplements? Hormone therapy products are effective for treating symptoms that are associated with menopause, such as hot flashes and night sweats. Hormone replacement carries certain risks, especially as you become older. If you are thinking about using estrogen or estrogen with progestin treatments, discuss the benefits and risks with your health care provider. What should I know about heart disease and stroke? Heart disease, heart attack, and stroke become more likely as you age. This may be due, in part, to the hormonal changes that your body experiences during menopause. These can affect how your body processes dietary fats, triglycerides, and cholesterol. Heart attack and stroke are both medical emergencies. There are many things that you can do to help prevent heart disease and stroke:  Have your blood pressure checked at least every 1-2 years. High blood pressure causes heart disease and increases the risk of stroke.  If you are 16-10 years old, ask your health care provider if you should take aspirin to  prevent a heart attack or a stroke.  Do not use any tobacco products, including cigarettes, chewing tobacco, or electronic cigarettes. If you need help quitting, ask your health care provider.  It is important to eat a healthy diet and maintain a healthy weight. ? Be sure to include plenty of vegetables, fruits, low-fat  dairy products, and lean protein. ? Avoid eating foods that are high in solid fats, added sugars, or salt (sodium).  Get regular exercise. This is one of the most important things that you can do for your health. ? Try to exercise for at least 150 minutes each week. The type of exercise that you do should increase your heart rate and make you sweat. This is known as moderate-intensity exercise. ? Try to do strengthening exercises at least twice each week. Do these in addition to the moderate-intensity exercise.  Know your numbers.Ask your health care provider to check your cholesterol and your blood glucose. Continue to have your blood tested as directed by your health care provider.  What should I know about cancer screening? There are several types of cancer. Take the following steps to reduce your risk and to catch any cancer development as early as possible. Breast Cancer  Practice breast self-awareness. ? This means understanding how your breasts normally appear and feel. ? It also means doing regular breast self-exams. Let your health care provider know about any changes, no matter how small.  If you are 71 or older, have a clinician do a breast exam (clinical breast exam or CBE) every year. Depending on your age, family history, and medical history, it may be recommended that you also have a yearly breast X-ray (mammogram).  If you have a family history of breast cancer, talk with your health care provider about genetic screening.  If you are at high risk for breast cancer, talk with your health care provider about having an MRI and a mammogram every year.  Breast cancer (BRCA) gene test is recommended for women who have family members with BRCA-related cancers. Results of the assessment will determine the need for genetic counseling and BRCA1 and for BRCA2 testing. BRCA-related cancers include these types: ? Breast. This occurs in males or females. ? Ovarian. ? Tubal. This may also  be called fallopian tube cancer. ? Cancer of the abdominal or pelvic lining (peritoneal cancer). ? Prostate. ? Pancreatic.  Cervical, Uterine, and Ovarian Cancer Your health care provider may recommend that you be screened regularly for cancer of the pelvic organs. These include your ovaries, uterus, and vagina. This screening involves a pelvic exam, which includes checking for microscopic changes to the surface of your cervix (Pap test).  For women ages 21-65, health care providers may recommend a pelvic exam and a Pap test every three years. For women ages 21-65, they may recommend the Pap test and pelvic exam, combined with testing for human papilloma virus (HPV), every five years. Some types of HPV increase your risk of cervical cancer. Testing for HPV may also be done on women of any age who have unclear Pap test results.  Other health care providers may not recommend any screening for nonpregnant women who are considered low risk for pelvic cancer and have no symptoms. Ask your health care provider if a screening pelvic exam is right for you.  If you have had past treatment for cervical cancer or a condition that could lead to cancer, you need Pap tests and screening for cancer for at least 20 years after  your treatment. If Pap tests have been discontinued for you, your risk factors (such as having a new sexual partner) need to be reassessed to determine if you should start having screenings again. Some women have medical problems that increase the chance of getting cervical cancer. In these cases, your health care provider may recommend that you have screening and Pap tests more often.  If you have a family history of uterine cancer or ovarian cancer, talk with your health care provider about genetic screening.  If you have vaginal bleeding after reaching menopause, tell your health care provider.  There are currently no reliable tests available to screen for ovarian cancer.  Lung  Cancer Lung cancer screening is recommended for adults 50-9 years old who are at high risk for lung cancer because of a history of smoking. A yearly low-dose CT scan of the lungs is recommended if you:  Currently smoke.  Have a history of at least 30 pack-years of smoking and you currently smoke or have quit within the past 15 years. A pack-year is smoking an average of one pack of cigarettes per day for one year.  Yearly screening should:  Continue until it has been 15 years since you quit.  Stop if you develop a health problem that would prevent you from having lung cancer treatment.  Colorectal Cancer  This type of cancer can be detected and can often be prevented.  Routine colorectal cancer screening usually begins at age 64 and continues through age 30.  If you have risk factors for colon cancer, your health care provider may recommend that you be screened at an earlier age.  If you have a family history of colorectal cancer, talk with your health care provider about genetic screening.  Your health care provider may also recommend using home test kits to check for hidden blood in your stool.  A small camera at the end of a tube can be used to examine your colon directly (sigmoidoscopy or colonoscopy). This is done to check for the earliest forms of colorectal cancer.  Direct examination of the colon should be repeated every 5-10 years until age 30. However, if early forms of precancerous polyps or small growths are found or if you have a family history or genetic risk for colorectal cancer, you may need to be screened more often.  Skin Cancer  Check your skin from head to toe regularly.  Monitor any moles. Be sure to tell your health care provider: ? About any new moles or changes in moles, especially if there is a change in a mole's shape or color. ? If you have a mole that is larger than the size of a pencil eraser.  If any of your family members has a history of skin  cancer, especially at a young age, talk with your health care provider about genetic screening.  Always use sunscreen. Apply sunscreen liberally and repeatedly throughout the day.  Whenever you are outside, protect yourself by wearing long sleeves, pants, a wide-brimmed hat, and sunglasses.  What should I know about osteoporosis? Osteoporosis is a condition in which bone destruction happens more quickly than new bone creation. After menopause, you may be at an increased risk for osteoporosis. To help prevent osteoporosis or the bone fractures that can happen because of osteoporosis, the following is recommended:  If you are 29-81 years old, get at least 1,000 mg of calcium and at least 600 mg of vitamin D per day.  If you are older than  age 65 but younger than age 1, get at least 1,200 mg of calcium and at least 600 mg of vitamin D per day.  If you are older than age 32, get at least 1,200 mg of calcium and at least 800 mg of vitamin D per day.  Smoking and excessive alcohol intake increase the risk of osteoporosis. Eat foods that are rich in calcium and vitamin D, and do weight-bearing exercises several times each week as directed by your health care provider. What should I know about how menopause affects my mental health? Depression may occur at any age, but it is more common as you become older. Common symptoms of depression include:  Low or sad mood.  Changes in sleep patterns.  Changes in appetite or eating patterns.  Feeling an overall lack of motivation or enjoyment of activities that you previously enjoyed.  Frequent crying spells.  Talk with your health care provider if you think that you are experiencing depression. What should I know about immunizations? It is important that you get and maintain your immunizations. These include:  Tetanus, diphtheria, and pertussis (Tdap) booster vaccine.  Influenza every year before the flu season begins.  Pneumonia  vaccine.  Shingles vaccine.  Your health care provider may also recommend other immunizations. This information is not intended to replace advice given to you by your health care provider. Make sure you discuss any questions you have with your health care provider. Document Released: 10/01/2005 Document Revised: 02/27/2016 Document Reviewed: 05/13/2015 Elsevier Interactive Patient Education  2018 [employer].

## 2018-02-22 NOTE — Progress Notes (Signed)
Medical screening examination/treatment/procedure(s) were performed by the Wellness Coach, RN. As primary care provider I was immediately available for consulation/collaboration. I agree with above documentation. Mekayla Soman, AGNP-C 

## 2018-03-15 ENCOUNTER — Ambulatory Visit (INDEPENDENT_AMBULATORY_CARE_PROVIDER_SITE_OTHER)
Admission: RE | Admit: 2018-03-15 | Discharge: 2018-03-15 | Disposition: A | Discharge status: Home / Self Care | Payer: Medicare Other | Source: Ambulatory Visit | Attending: Nurse Practitioner | Admitting: Nurse Practitioner

## 2018-03-15 DIAGNOSIS — Z780 Asymptomatic menopausal state: Secondary | ICD-10-CM | POA: Diagnosis not present

## 2018-04-12 ENCOUNTER — Ambulatory Visit (HOSPITAL_BASED_OUTPATIENT_CLINIC_OR_DEPARTMENT_OTHER)
Admission: RE | Admit: 2018-04-12 | Discharge: 2018-04-12 | Disposition: A | Discharge status: Home / Self Care | Payer: Medicare Other | Source: Ambulatory Visit | Attending: Nurse Practitioner | Admitting: Nurse Practitioner

## 2018-04-12 DIAGNOSIS — Z1231 Encounter for screening mammogram for malignant neoplasm of breast: Secondary | ICD-10-CM | POA: Diagnosis not present

## 2018-04-12 DIAGNOSIS — Z1239 Encounter for other screening for malignant neoplasm of breast: Secondary | ICD-10-CM

## 2018-05-08 ENCOUNTER — Ambulatory Visit (INDEPENDENT_AMBULATORY_CARE_PROVIDER_SITE_OTHER): Payer: Medicare Other

## 2018-05-08 ENCOUNTER — Encounter: Payer: Self-pay | Admitting: Nurse Practitioner

## 2018-05-08 ENCOUNTER — Ambulatory Visit (INDEPENDENT_AMBULATORY_CARE_PROVIDER_SITE_OTHER): Payer: Medicare Other | Admitting: Nurse Practitioner

## 2018-05-08 VITALS — BP 130/78 | HR 65 | Temp 98.0°F | Ht 63.0 in | Wt 171.0 lb

## 2018-05-08 DIAGNOSIS — J40 Bronchitis, not specified as acute or chronic: Secondary | ICD-10-CM | POA: Diagnosis not present

## 2018-05-08 DIAGNOSIS — R0782 Intercostal pain: Secondary | ICD-10-CM | POA: Diagnosis not present

## 2018-05-08 DIAGNOSIS — M791 Myalgia, unspecified site: Secondary | ICD-10-CM | POA: Diagnosis not present

## 2018-05-08 DIAGNOSIS — L989 Disorder of the skin and subcutaneous tissue, unspecified: Secondary | ICD-10-CM | POA: Diagnosis not present

## 2018-05-08 LAB — CBC WITH DIFFERENTIAL/PLATELET
BASOS ABS: 0.1 10*3/uL (ref 0.0–0.1)
Basophils Relative: 0.9 % (ref 0.0–3.0)
EOS ABS: 0 10*3/uL (ref 0.0–0.7)
Eosinophils Relative: 0.5 % (ref 0.0–5.0)
HCT: 41.3 % (ref 36.0–46.0)
HEMOGLOBIN: 13.4 g/dL (ref 12.0–15.0)
LYMPHS PCT: 28.8 % (ref 12.0–46.0)
Lymphs Abs: 2.2 10*3/uL (ref 0.7–4.0)
MCHC: 32.5 g/dL (ref 30.0–36.0)
MCV: 84.7 fl (ref 78.0–100.0)
Monocytes Absolute: 0.4 10*3/uL (ref 0.1–1.0)
Monocytes Relative: 5.9 % (ref 3.0–12.0)
Neutro Abs: 4.8 10*3/uL (ref 1.4–7.7)
Neutrophils Relative %: 63.9 % (ref 43.0–77.0)
Platelets: 200 10*3/uL (ref 150.0–400.0)
RBC: 4.88 Mil/uL (ref 3.87–5.11)
RDW: 13.9 % (ref 11.5–15.5)
WBC: 7.6 10*3/uL (ref 4.0–10.5)

## 2018-05-08 LAB — SEDIMENTATION RATE: Sed Rate: 23 mm/hr (ref 0–30)

## 2018-05-08 MED ORDER — ACETAMINOPHEN 500 MG PO TABS: 500.0000 mg | ORAL_TABLET | Freq: Four times a day (QID) | ORAL | 0 refills | Status: DC | PRN

## 2018-05-08 NOTE — Patient Instructions (Addendum)
CXR indicates possible bronchitis Naproxen and albuterol prescribed. She is to take azithromycin if no improvement by 9/201/2019. She was notified of CXR results via home number on chart. She reported improved pain with tylenol 1000mg  at 1pm. Advised to take another dose of tylenol at 7pm tonight. She is to call office if pain does not resolve by tomorrow. She verbalized understanding.  You will be contacted to schedule appt with dermatology.   Chest Wall Pain Chest wall pain is pain in or around the bones and muscles of your chest. Sometimes, an injury causes this pain. Sometimes, the cause may not be known. This pain may take several weeks or longer to get better. Follow these instructions at home: Pay attention to any changes in your symptoms. Take these actions to help with your pain:  Rest as told by your doctor.  Avoid activities that cause pain. Try not to use your chest, belly (abdominal), or side muscles to lift heavy things.  If directed, apply ice to the painful area: ? Put ice in a plastic bag. ? Place a towel between your skin and the bag. ? Leave the ice on for 20 minutes, 2-3 times per day.  Take over-the-counter and prescription medicines only as told by your doctor.  Do not use tobacco products, including cigarettes, chewing tobacco, and e-cigarettes. If you need help quitting, ask your doctor.  Keep all follow-up visits as told by your doctor. This is important.  Contact a doctor if:  You have a fever.  Your chest pain gets worse.  You have new symptoms. Get help right away if:  You feel sick to your stomach (nauseous) or you throw up (vomit).  You feel sweaty or light-headed.  You have a cough with phlegm (sputum) or you cough up blood.  You are short of breath. This information is not intended to replace advice given to you by your health care provider. Make sure you discuss any questions you have with your health care provider. Document Released:  01/26/2008 Document Revised: 01/15/2016 Document Reviewed: 11/04/2014 Elsevier Interactive Patient Education  [employer].

## 2018-05-08 NOTE — Progress Notes (Signed)
Subjective:  Patient ID: Heather Winters, female    DOB: [birth year]  Age: 72 y.o. MRN: [id number]  CC: Pain (patient is complaining of upper body pain, this has been goin gon for 2 days. took aspirin at home. denied of any injury. )  Chest Pain   This is a new problem. The current episode started in the past 7 days. The onset quality is gradual. The problem occurs intermittently. The problem has been waxing and waning. Pain location: unabale to locate. The pain is moderate. The quality of the pain is described as sharp. The pain does not radiate. Pertinent negatives include no abdominal pain, back pain, cough, dizziness, exertional chest pressure, fever, irregular heartbeat, leg pain, malaise/fatigue, nausea, orthopnea, palpitations, PND, shortness of breath, vomiting or weakness. The pain is aggravated by movement. Treatments tried: aspirin. The treatment provided no relief. Risk factors include lack of exercise, sedentary lifestyle and post-menopausal.   She reports lesion on scalp, present for over 1year, she will like referral to dermatology to evaluate.  Reviewed past Medical, Social and Family history today.  Outpatient Medications Prior to Visit  Medication Sig Dispense Refill  . famotidine (PEPCID) 20 MG tablet Take 1 tablet (20 mg total) by mouth daily as needed for heartburn or indigestion.     No facility-administered medications prior to visit.     ROS See HPI  Objective:  BP 130/78   Pulse 65   Temp 98 F (36.7 C) (Oral)   Ht 5\' 3"  (1.6 m)   Wt 171 lb (77.6 kg)   SpO2 98%   BMI 30.29 kg/m   BP Readings from Last 3 Encounters:  05/08/18 130/78  02/22/18 132/84  10/14/17 138/70    Wt Readings from Last 3 Encounters:  05/08/18 171 lb (77.6 kg)  02/22/18 168 lb 6.4 oz (76.4 kg)  10/25/17 168 lb (76.2 kg)    Physical Exam  Constitutional: She is oriented to person, place, and time. She appears well-developed and well-nourished. No distress.  Neck: Normal range  of motion. Neck supple. No thyromegaly present.  Cardiovascular: Normal rate, regular rhythm and intact distal pulses.  Murmur heard. Pulmonary/Chest: Effort normal and breath sounds normal. She exhibits no tenderness.  Abdominal: Soft. Bowel sounds are normal. She exhibits no distension. There is no tenderness. There is no guarding.  Musculoskeletal: Normal range of motion. She exhibits no edema or tenderness.  Lymphadenopathy:    She has no cervical adenopathy.  Neurological: She is alert and oriented to person, place, and time.  Skin: Skin is warm and dry. No rash noted.  Psychiatric: She has a normal mood and affect. Her behavior is normal.  Vitals reviewed.   Lab Results  Component Value Date   WBC 7.6 05/08/2018   HGB 13.4 05/08/2018   HCT 41.3 05/08/2018   PLT 200.0 05/08/2018   GLUCOSE 91 09/02/2017   CHOL 206 (H) 09/02/2017   TRIG 103.0 09/02/2017   HDL 53.00 09/02/2017   LDLCALC 132 (H) 09/02/2017   ALT 13 09/02/2017   AST 15 09/02/2017   NA 140 09/02/2017   K 4.5 09/02/2017   CL 102 09/02/2017   CREATININE 0.66 09/02/2017   BUN 12 09/02/2017   CO2 29 09/02/2017    Mm 3d Screen Breast Bilateral  Result Date: 04/12/2018 CLINICAL DATA:  Screening. EXAM: DIGITAL SCREENING BILATERAL MAMMOGRAM WITH TOMO AND CAD COMPARISON:  None. ACR Breast Density Category b: There are scattered areas of fibroglandular density. FINDINGS: There are no findings suspicious  for malignancy. Images were processed with CAD. IMPRESSION: No mammographic evidence of malignancy. A result letter of this screening mammogram will be mailed directly to the patient. RECOMMENDATION: Screening mammogram in one year. (Code:SM-B-01Y) BI-RADS CATEGORY  1: Negative. Electronically Signed   By: Lillia Mountain M.D.   On: 04/12/2018 12:39    Assessment & Plan:   Thera was seen today for pain.  Diagnoses and all orders for this visit:  Bronchitis -     DG Chest 2 View -     CBC w/Diff -     acetaminophen  (TYLENOL) 500 MG tablet; Take 1 tablet (500 mg total) by mouth every 6 (six) hours as needed. -     albuterol (PROVENTIL HFA;VENTOLIN HFA) 108 (90 Base) MCG/ACT inhaler; Inhale 1-2 puffs into the lungs every 8 (eight) hours as needed for wheezing or shortness of breath. -     naproxen (NAPROSYN) 500 MG tablet; Take 1 tablet (500 mg total) by mouth 2 (two) times daily with a meal. -     azithromycin (ZITHROMAX Z-PAK) 250 MG tablet; Take 1 tablet (250 mg total) by mouth daily for 5 days. Take 2tabs on first day, then 1tab once a day till complete  Myalgia -     DG Chest 2 View -     CBC w/Diff -     Sedimentation rate -     acetaminophen (TYLENOL) 500 MG tablet; Take 1 tablet (500 mg total) by mouth every 6 (six) hours as needed. -     naproxen (NAPROSYN) 500 MG tablet; Take 1 tablet (500 mg total) by mouth 2 (two) times daily with a meal.  Lesion of skin of scalp -     Ambulatory referral to Dermatology   I am having Macky Lower. Hlavac start on acetaminophen, albuterol, naproxen, and azithromycin. I am also having her maintain her famotidine.  Meds ordered this encounter  Medications  . acetaminophen (TYLENOL) 500 MG tablet    Sig: Take 1 tablet (500 mg total) by mouth every 6 (six) hours as needed.    Dispense:  30 tablet    Refill:  0    Order Specific Question:   Supervising Provider    Answer:   Lucille Passy [[id number]]  . albuterol (PROVENTIL HFA;VENTOLIN HFA) 108 (90 Base) MCG/ACT inhaler    Sig: Inhale 1-2 puffs into the lungs every 8 (eight) hours as needed for wheezing or shortness of breath.    Dispense:  1 Inhaler    Refill:  0    Order Specific Question:   Supervising Provider    Answer:   MATTHEWS, CODY [[id number]]  . naproxen (NAPROSYN) 500 MG tablet    Sig: Take 1 tablet (500 mg total) by mouth 2 (two) times daily with a meal.    Dispense:  10 tablet    Refill:  0    Order Specific Question:   Supervising Provider    Answer:   MATTHEWS, CODY [[id number]]  . azithromycin (ZITHROMAX  Z-PAK) 250 MG tablet    Sig: Take 1 tablet (250 mg total) by mouth daily for 5 days. Take 2tabs on first day, then 1tab once a day till complete    Dispense:  6 tablet    Refill:  0    Do not fill before 05/12/2018    Order Specific Question:   Supervising Provider    Answer:   Luetta Nutting [[id number]]    Follow-up: Return if symptoms worsen or fail to improve.  Wilfred Lacy, NP

## 2018-05-09 DIAGNOSIS — R0789 Other chest pain: Secondary | ICD-10-CM | POA: Diagnosis not present

## 2018-05-09 MED ORDER — NAPROXEN 500 MG PO TABS: 500.0000 mg | ORAL_TABLET | Freq: Two times a day (BID) | ORAL | 0 refills | Status: DC

## 2018-05-09 MED ORDER — AZITHROMYCIN 250 MG PO TABS: 250.0000 mg | ORAL_TABLET | Freq: Every day | ORAL | 0 refills | Status: AC

## 2018-05-09 MED ORDER — ALBUTEROL SULFATE HFA 108 (90 BASE) MCG/ACT IN AERS: 1.0000 | INHALATION_SPRAY | Freq: Three times a day (TID) | RESPIRATORY_TRACT | 0 refills | Status: DC | PRN

## 2018-06-12 DIAGNOSIS — L821 Other seborrheic keratosis: Secondary | ICD-10-CM | POA: Diagnosis not present

## 2018-06-12 DIAGNOSIS — D2372 Other benign neoplasm of skin of left lower limb, including hip: Secondary | ICD-10-CM | POA: Diagnosis not present

## 2018-07-05 ENCOUNTER — Ambulatory Visit (INDEPENDENT_AMBULATORY_CARE_PROVIDER_SITE_OTHER): Payer: Medicare Other | Admitting: Behavioral Health

## 2018-07-05 DIAGNOSIS — Z23 Encounter for immunization: Secondary | ICD-10-CM

## 2018-07-05 NOTE — Progress Notes (Signed)
Patient came in clinic today for Influenza vaccination. IM injection was given in the left deltoid. Patient tolerated the injection well. No signs or symptoms of a reaction were noted prior to patient leaving the nurse visit.

## 2019-01-02 ENCOUNTER — Other Ambulatory Visit: Payer: Self-pay

## 2019-01-02 ENCOUNTER — Emergency Department (HOSPITAL_COMMUNITY)
Admission: EM | Admit: 2019-01-02 | Discharge: 2019-01-02 | Disposition: A | Discharge status: Home / Self Care | Payer: Medicare Other | Attending: Emergency Medicine | Admitting: Emergency Medicine

## 2019-01-02 ENCOUNTER — Encounter (HOSPITAL_COMMUNITY): Payer: Self-pay | Admitting: Obstetrics and Gynecology

## 2019-01-02 ENCOUNTER — Emergency Department (HOSPITAL_COMMUNITY): Payer: Medicare Other

## 2019-01-02 DIAGNOSIS — N201 Calculus of ureter: Secondary | ICD-10-CM | POA: Diagnosis not present

## 2019-01-02 DIAGNOSIS — F17210 Nicotine dependence, cigarettes, uncomplicated: Secondary | ICD-10-CM | POA: Diagnosis not present

## 2019-01-02 DIAGNOSIS — R1032 Left lower quadrant pain: Secondary | ICD-10-CM | POA: Diagnosis not present

## 2019-01-02 DIAGNOSIS — N132 Hydronephrosis with renal and ureteral calculous obstruction: Secondary | ICD-10-CM | POA: Diagnosis not present

## 2019-01-02 DIAGNOSIS — Z79899 Other long term (current) drug therapy: Secondary | ICD-10-CM | POA: Insufficient documentation

## 2019-01-02 DIAGNOSIS — R109 Unspecified abdominal pain: Secondary | ICD-10-CM | POA: Diagnosis present

## 2019-01-02 DIAGNOSIS — R61 Generalized hyperhidrosis: Secondary | ICD-10-CM | POA: Diagnosis not present

## 2019-01-02 DIAGNOSIS — R1084 Generalized abdominal pain: Secondary | ICD-10-CM | POA: Diagnosis not present

## 2019-01-02 DIAGNOSIS — R52 Pain, unspecified: Secondary | ICD-10-CM | POA: Diagnosis not present

## 2019-01-02 DIAGNOSIS — R112 Nausea with vomiting, unspecified: Secondary | ICD-10-CM | POA: Diagnosis not present

## 2019-01-02 DIAGNOSIS — I10 Essential (primary) hypertension: Secondary | ICD-10-CM | POA: Diagnosis not present

## 2019-01-02 LAB — COMPREHENSIVE METABOLIC PANEL
ALT: 23 U/L (ref 0–44)
AST: 28 U/L (ref 15–41)
Albumin: 4.8 g/dL (ref 3.5–5.0)
Alkaline Phosphatase: 77 U/L (ref 38–126)
Anion gap: 13 (ref 5–15)
BUN: 15 mg/dL (ref 8–23)
CO2: 20 mmol/L — ABNORMAL LOW (ref 22–32)
Calcium: 9.6 mg/dL (ref 8.9–10.3)
Chloride: 109 mmol/L (ref 98–111)
Creatinine, Ser: 0.97 mg/dL (ref 0.44–1.00)
GFR calc Af Amer: >60 mL/min (ref >60)
GFR calc non Af Amer: 58 mL/min — ABNORMAL LOW (ref >60)
Glucose, Bld: 156 mg/dL — ABNORMAL HIGH (ref 70–99)
Potassium: 3.2 mmol/L — ABNORMAL LOW (ref 3.5–5.1)
Sodium: 142 mmol/L (ref 135–145)
Total Bilirubin: 1 mg/dL (ref 0.3–1.2)
Total Protein: 7.9 g/dL (ref 6.5–8.1)

## 2019-01-02 LAB — CBC
HCT: 43.4 % (ref 36.0–46.0)
Hemoglobin: 13.9 g/dL (ref 12.0–15.0)
MCH: 28.1 pg (ref 26.0–34.0)
MCHC: 32 g/dL (ref 30.0–36.0)
MCV: 87.9 fL (ref 80.0–100.0)
Platelets: 215 10*3/uL (ref 150–400)
RBC: 4.94 MIL/uL (ref 3.87–5.11)
RDW: 13.5 % (ref 11.5–15.5)
WBC: 13 10*3/uL — ABNORMAL HIGH (ref 4.0–10.5)
nRBC: 0 % (ref 0.0–0.2)

## 2019-01-02 LAB — LIPASE, BLOOD: Lipase: 20 U/L (ref 11–51)

## 2019-01-02 MED ORDER — TAMSULOSIN HCL 0.4 MG PO CAPS: 0.4000 mg | ORAL_CAPSULE | Freq: Every day | ORAL | 0 refills | Status: DC

## 2019-01-02 MED ORDER — HYDROMORPHONE HCL 1 MG/ML IJ SOLN
1.0000 mg | Freq: Once | INTRAMUSCULAR | Status: AC
  Administered 2019-01-02: 13:00:00 1 mg via INTRAVENOUS
  Filled 2019-01-02: qty 1

## 2019-01-02 MED ORDER — ONDANSETRON 8 MG PO TBDP: 8.0000 mg | ORAL_TABLET | Freq: Three times a day (TID) | ORAL | 0 refills | Status: DC | PRN

## 2019-01-02 MED ORDER — HYDROCODONE-ACETAMINOPHEN 5-325 MG PO TABS: 1.0000 | ORAL_TABLET | ORAL | 0 refills | Status: DC | PRN

## 2019-01-02 MED ORDER — SODIUM CHLORIDE 0.9% FLUSH
3.0000 mL | Freq: Once | INTRAVENOUS | Status: AC
  Administered 2019-01-02: 13:00:00 3 mL via INTRAVENOUS

## 2019-01-02 MED ORDER — IBUPROFEN 600 MG PO TABS: 600.0000 mg | ORAL_TABLET | Freq: Three times a day (TID) | ORAL | 0 refills | Status: DC | PRN

## 2019-01-02 MED ORDER — KETOROLAC TROMETHAMINE 30 MG/ML IJ SOLN
30.0000 mg | Freq: Once | INTRAMUSCULAR | Status: AC
  Administered 2019-01-02: 30 mg via INTRAVENOUS
  Filled 2019-01-02: qty 1

## 2019-01-02 MED ORDER — ONDANSETRON HCL 4 MG/2ML IJ SOLN
4.0000 mg | Freq: Once | INTRAMUSCULAR | Status: AC
  Administered 2019-01-02: 4 mg via INTRAVENOUS
  Filled 2019-01-02: qty 2

## 2019-01-02 NOTE — ED Notes (Signed)
Bed: WA17 Expected date:  Expected time:  Means of arrival:  Comments: EMS 73 yo abdominal pain, urine retention

## 2019-01-02 NOTE — ED Triage Notes (Signed)
Per EMS: Pt is coming from home. Pt reportedly was seen at urgent care for LLQ abdominal pain that radiates to the periumbilical area. Pt reportedly has had some nausea with no emesis with EMS.  Pt is alert and oriented at this time  Pt denies any pertinent medical.

## 2019-01-02 NOTE — ED Provider Notes (Signed)
[facility] DEPT Provider Note   CSN: [id number] Arrival date & time: 01/02/19  1242    History   Chief Complaint Chief Complaint  Patient presents with  . Abdominal Pain    HPI Heather Winters is a 73 y.o. female.     HPI 73 year old female presents emergency department acute onset left flank pain with radiation towards her left groin.  No history of kidney stones.  Reports nausea and vomiting.  No fevers or chills.  No dysuria.  Symptoms are moderate to severe in severity   Past Medical History:  Diagnosis Date  . Arthritis   . History of fainting spells of unknown cause     Patient Active Problem List   Diagnosis Date Noted  . Atypical chest pain 10/14/2017  . Abnormal electrocardiogram 10/14/2017  . Heart murmur 10/14/2017  . Sensorineural hearing loss (SNHL) of both ears 10/11/2017  . GERD (gastroesophageal reflux disease) 09/16/2017  . Elevated BP without diagnosis of hypertension 09/16/2017    No past surgical history on file.   OB History    Gravida      Para      Term      Preterm      AB      Living  0     SAB      TAB      Ectopic      Multiple      Live Births               Home Medications    Prior to Admission medications   Medication Sig Start Date End Date Taking? Authorizing Provider  acetaminophen (TYLENOL) 500 MG tablet Take 1 tablet (500 mg total) by mouth every 6 (six) hours as needed. 05/08/18   Nche, Charlene Brooke, NP  albuterol (PROVENTIL HFA;VENTOLIN HFA) 108 (90 Base) MCG/ACT inhaler Inhale 1-2 puffs into the lungs every 8 (eight) hours as needed for wheezing or shortness of breath. 05/09/18   Nche, Charlene Brooke, NP  famotidine (PEPCID) 20 MG tablet Take 1 tablet (20 mg total) by mouth daily as needed for heartburn or indigestion. 09/16/17   Nche, Charlene Brooke, NP  HYDROcodone-acetaminophen (NORCO/VICODIN) 5-325 MG tablet Take 1 tablet by mouth every 4 (four) hours as needed for  moderate pain. 01/02/19   Jola Schmidt, MD  ibuprofen (ADVIL) 600 MG tablet Take 1 tablet (600 mg total) by mouth every 8 (eight) hours as needed. 01/02/19   Jola Schmidt, MD  naproxen (NAPROSYN) 500 MG tablet Take 1 tablet (500 mg total) by mouth 2 (two) times daily with a meal. 05/09/18   Nche, Charlene Brooke, NP  ondansetron (ZOFRAN ODT) 8 MG disintegrating tablet Take 1 tablet (8 mg total) by mouth every 8 (eight) hours as needed for nausea or vomiting. 01/02/19   Jola Schmidt, MD  tamsulosin (FLOMAX) 0.4 MG CAPS capsule Take 1 capsule (0.4 mg total) by mouth daily. 01/02/19   Jola Schmidt, MD    Family History Family History  Problem Relation Age of Onset  . Diabetes Mother   . Diabetes Brother   . Diabetes Brother   . Diabetes Brother   . COPD Father   . Heart disease Neg Hx     Social History Social History   Tobacco Use  . Smoking status: Current Some Day Smoker    Packs/day: 1.00    Years: 35.00    Pack years: 35.00    Types: Cigarettes  . Smokeless tobacco: Never  Used  Substance Use Topics  . Alcohol use: No    Frequency: Never  . Drug use: No     Allergies   Patient has no known allergies.   Review of Systems Review of Systems  All other systems reviewed and are negative.    Physical Exam Updated Vital Signs BP (!) 151/119 (BP Location: Left Arm)   Pulse 65   Temp (!) 97.5 F (36.4 C) (Oral)   Resp (!) 22   Ht 5\' 2"  (1.575 m)   Wt 77.1 kg   SpO2 100%   BMI 31.09 kg/m   Physical Exam Vitals signs and nursing note reviewed.  Constitutional:      General: She is not in acute distress.    Appearance: She is well-developed.  HENT:     Head: Normocephalic and atraumatic.  Neck:     Musculoskeletal: Normal range of motion.  Cardiovascular:     Rate and Rhythm: Normal rate and regular rhythm.     Heart sounds: Normal heart sounds.  Pulmonary:     Effort: Pulmonary effort is normal.     Breath sounds: Normal breath sounds.  Abdominal:      General: There is no distension.     Palpations: Abdomen is soft.     Tenderness: There is no abdominal tenderness.  Musculoskeletal: Normal range of motion.  Skin:    General: Skin is warm and dry.  Neurological:     Mental Status: She is alert and oriented to person, place, and time.  Psychiatric:        Judgment: Judgment normal.      ED Treatments / Results  Labs (all labs ordered are listed, but only abnormal results are displayed) Labs Reviewed  COMPREHENSIVE METABOLIC PANEL - Abnormal; Notable for the following components:      Result Value   Potassium 3.2 (*)    CO2 20 (*)    Glucose, Bld 156 (*)    GFR calc non Af Amer 58 (*)    All other components within normal limits  CBC - Abnormal; Notable for the following components:   WBC 13.0 (*)    All other components within normal limits  LIPASE, BLOOD    EKG None  Radiology Ct Renal Stone Study  Result Date: 01/02/2019 CLINICAL DATA:  Flank pain.  Left low pelvic pain since last night. EXAM: CT ABDOMEN AND PELVIS WITHOUT CONTRAST TECHNIQUE: Multidetector CT imaging of the abdomen and pelvis was performed following the standard protocol without IV contrast. COMPARISON:  None. FINDINGS: Lower chest: No acute abnormality. Hepatobiliary: No focal liver abnormality is seen. No gallstones, gallbladder wall thickening, or biliary dilatation. Pancreas: Unremarkable. No pancreatic ductal dilatation or surrounding inflammatory changes. Spleen: Normal in size without focal abnormality. Adrenals/Urinary Tract: The right kidney is unremarkable aside from a small 3-4 mm nonobstructing stone in the interpolar region. There is no right-sided hydronephrosis. There is a 1.5 cm cyst in the upper pole of the left kidney. There is mild left-sided hydroureteronephrosis secondary to obstructing 2-3 mm stone in the proximal left ureter (axial series 2, image 43). There is a residual 1-2 mm stone in the lower pole of the left kidney. The bladder is  unremarkable. There is no adrenal mass. Stomach/Bowel: Stomach is within normal limits. Appendix appears normal. No evidence of bowel wall thickening, distention, or inflammatory changes. Vascular/Lymphatic: Aortic atherosclerosis. No enlarged abdominal or pelvic lymph nodes. Reproductive: Uterus and bilateral adnexa are unremarkable. There are probable fibroids at the uterine  fundus. Other: No abdominal wall hernia or abnormality. No abdominopelvic ascites. Musculoskeletal: No acute or significant osseous findings. IMPRESSION: 1. Mild left-sided hydroureteronephrosis secondary to an obstructing 2-3 mm stone in the proximal left ureter. 2. Bilateral nephrolithiasis as detailed above. Electronically Signed   By: Constance Holster M.D.   On: 01/02/2019 14:37    Procedures Procedures (including critical care time)  Medications Ordered in ED Medications  sodium chloride flush (NS) 0.9 % injection 3 mL (3 mLs Intravenous Given 01/02/19 1327)  ketorolac (TORADOL) 30 MG/ML injection 30 mg (30 mg Intravenous Given 01/02/19 1326)  HYDROmorphone (DILAUDID) injection 1 mg (1 mg Intravenous Given 01/02/19 1324)  ondansetron (ZOFRAN) injection 4 mg (4 mg Intravenous Given 01/02/19 1325)     Initial Impression / Assessment and Plan / ED Course  I have reviewed the triage vital signs and the nursing notes.  Pertinent labs & imaging results that were available during my care of the patient were reviewed by me and considered in my medical decision making (see chart for details).        Resolution of patient's symptoms here in the emergency department.  Left-sided ureteral stone.  Pain controlled.  Standard stone precautions.  Urology follow-up.  Patient understands return to the emergency department for new or worsening symptoms outpatient urology follow-up  Final Clinical Impressions(s) / ED Diagnoses   Final diagnoses:  Left ureteral stone    ED Discharge Orders         Ordered    ondansetron (ZOFRAN  ODT) 8 MG disintegrating tablet  Every 8 hours PRN     01/02/19 1459    ibuprofen (ADVIL) 600 MG tablet  Every 8 hours PRN     01/02/19 1459    HYDROcodone-acetaminophen (NORCO/VICODIN) 5-325 MG tablet  Every 4 hours PRN     01/02/19 1459    tamsulosin (FLOMAX) 0.4 MG CAPS capsule  Daily     01/02/19 1459           Jola Schmidt, MD 01/02/19 1515

## 2019-01-05 DIAGNOSIS — N201 Calculus of ureter: Secondary | ICD-10-CM | POA: Diagnosis not present

## 2019-01-15 DIAGNOSIS — Z1159 Encounter for screening for other viral diseases: Secondary | ICD-10-CM | POA: Diagnosis not present

## 2019-05-08 NOTE — Progress Notes (Signed)
Virtual Visit via Video Note  I connected with patient on 05/09/19 at  9:30 AM EDT by audio enabled telemedicine application and verified that I am speaking with the correct person using two identifiers.   THIS ENCOUNTER IS A VIRTUAL VISIT DUE TO COVID-19 - PATIENT WAS NOT SEEN IN THE OFFICE. PATIENT HAS CONSENTED TO VIRTUAL VISIT / TELEMEDICINE VISIT   Location of patient: home  Location of provider: office  I discussed the limitations of evaluation and management by telemedicine and the availability of in person appointments. The patient expressed understanding and agreed to proceed.   Subjective:   Heather Winters is a 73 y.o. female who presents for Medicare Annual (Subsequent) preventive examination.  Review of Systems:  Cardiac Risk Factors include: advanced age (>52men, >20 women)   Home Safety/Smoke Alarms: Feels safe in home. Smoke alarms in place.  Lives alone in 1st floor apt.    Female:       Mammo- 04/12/18. ordered     Dexa scan- 03/15/18       CCS- pt would like to discuss w/ PCP     Objective:     Vitals: BP 139/88 Comment: pt reported  Wt 173 lb 9.6 oz (78.7 kg)   BMI 31.75 kg/m   Body mass index is 31.75 kg/m.  Advanced Directives 05/09/2019 01/02/2019 02/22/2018  Does Patient Have a Medical Advance Directive? No No No  Would patient like information on creating a medical advance directive? No - Patient declined No - Guardian declined No - Patient declined    Tobacco Social History   Tobacco Use  Smoking Status Current Some Day Smoker  . Packs/day: 0.25  . Years: 35.00  . Pack years: 8.75  . Types: Cigarettes  Smokeless Tobacco Never Used  Tobacco Comment   pt states she has all the info     Ready to quit: Yes Counseling given: No Comment: pt states she has all the info   Clinical Intake: Pain : No/denies pain    Past Medical History:  Diagnosis Date  . Arthritis   . History of fainting spells of unknown cause    History reviewed. No  pertinent surgical history. Family History  Problem Relation Age of Onset  . Diabetes Mother   . Diabetes Brother   . Diabetes Brother   . Diabetes Brother   . COPD Father   . Heart disease Neg Hx    Social History   Socioeconomic History  . Marital status: Widowed    Spouse name: Not on file  . Number of children: Not on file  . Years of education: Not on file  . Highest education level: Not on file  Occupational History  . Not on file  Social Needs  . Financial resource strain: Not on file  . Food insecurity    Worry: Not on file    Inability: Not on file  . Transportation needs    Medical: Not on file    Non-medical: Not on file  Tobacco Use  . Smoking status: Current Some Day Smoker    Packs/day: 0.25    Years: 35.00    Pack years: 8.75    Types: Cigarettes  . Smokeless tobacco: Never Used  . Tobacco comment: pt states she has all the info  Substance and Sexual Activity  . Alcohol use: No    Frequency: Never  . Drug use: No  . Sexual activity: Not Currently  Lifestyle  . Physical activity    Days  per week: Not on file    Minutes per session: Not on file  . Stress: Not on file  Relationships  . Social Herbalist on phone: Not on file    Gets together: Not on file    Attends religious service: Not on file    Active member of club or organization: Not on file    Attends meetings of clubs or organizations: Not on file    Relationship status: Not on file  Other Topics Concern  . Not on file  Social History Narrative  . Not on file    Outpatient Encounter Medications as of 05/09/2019  Medication Sig  . acetaminophen (TYLENOL) 500 MG tablet Take 1 tablet (500 mg total) by mouth every 6 (six) hours as needed.  Marland Kitchen albuterol (PROVENTIL HFA;VENTOLIN HFA) 108 (90 Base) MCG/ACT inhaler Inhale 1-2 puffs into the lungs every 8 (eight) hours as needed for wheezing or shortness of breath.  . famotidine (PEPCID) 20 MG tablet Take 1 tablet (20 mg total) by  mouth daily as needed for heartburn or indigestion.  Marland Kitchen HYDROcodone-acetaminophen (NORCO/VICODIN) 5-325 MG tablet Take 1 tablet by mouth every 4 (four) hours as needed for moderate pain.  Marland Kitchen ibuprofen (ADVIL) 600 MG tablet Take 1 tablet (600 mg total) by mouth every 8 (eight) hours as needed.  . naproxen (NAPROSYN) 500 MG tablet Take 1 tablet (500 mg total) by mouth 2 (two) times daily with a meal.  . ondansetron (ZOFRAN ODT) 8 MG disintegrating tablet Take 1 tablet (8 mg total) by mouth every 8 (eight) hours as needed for nausea or vomiting. (Patient not taking: Reported on 05/09/2019)  . tamsulosin (FLOMAX) 0.4 MG CAPS capsule Take 1 capsule (0.4 mg total) by mouth daily. (Patient not taking: Reported on 05/09/2019)   No facility-administered encounter medications on file as of 05/09/2019.     Activities of Daily Living In your present state of health, do you have any difficulty performing the following activities: 05/09/2019  Hearing? N  Vision? N  Difficulty concentrating or making decisions? N  Walking or climbing stairs? N  Dressing or bathing? N  Doing errands, shopping? N  Preparing Food and eating ? N  Using the Toilet? N  In the past six months, have you accidently leaked urine? N  Do you have problems with loss of bowel control? N  Managing your Medications? N  Managing your Finances? N  Housekeeping or managing your Housekeeping? N  Some recent data might be hidden    Patient Care Team: Nche, Charlene Brooke, NP as PCP - General (Internal Medicine) End, Harrell Gave, MD as PCP - Cardiology (Cardiology)    Assessment:   This is a routine wellness examination for [facility]. Physical assessment deferred to PCP.  Exercise Activities and Dietary recommendations Current Exercise Habits: Home exercise routine, Type of exercise: walking, Time (Minutes): 30, Frequency (Times/Week): 2, Weekly Exercise (Minutes/Week): 60, Intensity: Mild, Exercise limited by: None identified   Diet (meal  preparation, eat out, water intake, caffeinated beverages, dairy products, fruits and vegetables): 24 hr recall Breakfast: 3 pc bacon and boiled egg. Lunch: fish and coleslow Dinner:  Watermelon Need to drink more water.  Goals    . DIET - INCREASE WATER INTAKE    . Increase physical activity       Fall Risk Fall Risk  05/09/2019 02/22/2018 09/02/2017  Falls in the past year? 0 No No    Depression Screen PHQ 2/9 Scores 05/09/2019 02/22/2018 09/02/2017  PHQ -  2 Score 0 1 6     Cognitive Function Ad8 score reviewed for issues:  Issues making decisions:no  Less interest in hobbies / activities:no  Repeats questions, stories (family complaining):no  Trouble using ordinary gadgets (microwave, computer, phone):no  Forgets the month or year: no  Mismanaging finances: no  Remembering appts:no  Daily problems with thinking and/or memory:no Ad8 score is=0         Immunization History  Administered Date(s) Administered  . Influenza, High Dose Seasonal PF 07/05/2018    Screening Tests Health Maintenance  Topic Date Due  . Hepatitis C Screening  05/14/1946  . TETANUS/TDAP  06/02/1964  . COLONOSCOPY  06/03/1995  . PNA vac Low Risk Adult (1 of 2 - PCV13) 06/02/2010  . INFLUENZA VACCINE  03/24/2019  . MAMMOGRAM  04/12/2020  . DEXA SCAN  Completed       Plan:    Please schedule your next medicare wellness visit with me in 1 yr.  Continue to eat heart healthy diet (full of fruits, vegetables, whole grains, lean protein, water--limit salt, fat, and sugar intake) and increase physical activity as tolerated.  Continue doing brain stimulating activities (puzzles, reading, adult coloring books, staying active) to keep memory sharp.   I have ordered your mammogram. Please schedule.   I have personally reviewed and noted the following in the patient's chart:   . Medical and social history . Use of alcohol, tobacco or illicit drugs  . Current medications and supplements .  Functional ability and status . Nutritional status . Physical activity . Advanced directives . List of other physicians . Hospitalizations, surgeries, and ER visits in previous 12 months . Vitals . Screenings to include cognitive, depression, and falls . Referrals and appointments  In addition, I have reviewed and discussed with patient certain preventive protocols, quality metrics, and best practice recommendations. A written personalized care plan for preventive services as well as general preventive health recommendations were provided to patient.     Shela Nevin, [place]  05/09/2019

## 2019-05-09 ENCOUNTER — Encounter: Payer: Self-pay | Admitting: *Deleted

## 2019-05-09 ENCOUNTER — Ambulatory Visit (INDEPENDENT_AMBULATORY_CARE_PROVIDER_SITE_OTHER): Payer: Medicare Other | Admitting: *Deleted

## 2019-05-09 VITALS — BP 139/88 | Wt 173.6 lb

## 2019-05-09 DIAGNOSIS — Z0000 Encounter for general adult medical examination without abnormal findings: Secondary | ICD-10-CM | POA: Diagnosis not present

## 2019-05-09 DIAGNOSIS — Z1231 Encounter for screening mammogram for malignant neoplasm of breast: Secondary | ICD-10-CM

## 2019-05-09 NOTE — Patient Instructions (Signed)
Please schedule your next medicare wellness visit with me in 1 yr.  Continue to eat heart healthy diet (full of fruits, vegetables, whole grains, lean protein, water--limit salt, fat, and sugar intake) and increase physical activity as tolerated.  Continue doing brain stimulating activities (puzzles, reading, adult coloring books, staying active) to keep memory sharp.   I have ordered your mammogram. Please schedule.    Heather Winters , Thank you for taking time to come for your Medicare Wellness Visit. I appreciate your ongoing commitment to your health goals. Please review the following plan we discussed and let me know if I can assist you in the future.   These are the goals we discussed: Goals    . DIET - INCREASE WATER INTAKE    . Increase physical activity       This is a list of the screening recommended for you and due dates:  Health Maintenance  Topic Date Due  .  Hepatitis C: One time screening is recommended by Center for Disease Control  (CDC) for  adults born from [birth year] through 1966.   May 29, 1946  . Tetanus Vaccine  06/02/1964  . Colon Cancer Screening  06/03/1995  . Pneumonia vaccines (1 of 2 - PCV13) 06/02/2010  . Flu Shot  03/24/2019  . Mammogram  04/12/2020  . DEXA scan (bone density measurement)  Completed    Health Maintenance After Age 53 After age 27, you are at a higher risk for certain long-term diseases and infections as well as injuries from falls. Falls are a major cause of broken bones and head injuries in people who are older than age 61. Getting regular preventive care can help to keep you healthy and well. Preventive care includes getting regular testing and making lifestyle changes as recommended by your health care provider. Talk with your health care provider about:  Which screenings and tests you should have. A screening is a test that checks for a disease when you have no symptoms.  A diet and exercise plan that is right for you. What should I know  about screenings and tests to prevent falls? Screening and testing are the best ways to find a health problem early. Early diagnosis and treatment give you the best chance of managing medical conditions that are common after age 46. Certain conditions and lifestyle choices may make you more likely to have a fall. Your health care provider may recommend:  Regular vision checks. Poor vision and conditions such as cataracts can make you more likely to have a fall. If you wear glasses, make sure to get your prescription updated if your vision changes.  Medicine review. Work with your health care provider to regularly review all of the medicines you are taking, including over-the-counter medicines. Ask your health care provider about any side effects that may make you more likely to have a fall. Tell your health care provider if any medicines that you take make you feel dizzy or sleepy.  Osteoporosis screening. Osteoporosis is a condition that causes the bones to get weaker. This can make the bones weak and cause them to break more easily.  Blood pressure screening. Blood pressure changes and medicines to control blood pressure can make you feel dizzy.  Strength and balance checks. Your health care provider may recommend certain tests to check your strength and balance while standing, walking, or changing positions.  Foot health exam. Foot pain and numbness, as well as not wearing proper footwear, can make you more likely to have  a fall.  Depression screening. You may be more likely to have a fall if you have a fear of falling, feel emotionally low, or feel unable to do activities that you used to do.  Alcohol use screening. Using too much alcohol can affect your balance and may make you more likely to have a fall. What actions can I take to lower my risk of falls? General instructions  Talk with your health care provider about your risks for falling. Tell your health care provider if: ? You fall.  Be sure to tell your health care provider about all falls, even ones that seem minor. ? You feel dizzy, sleepy, or off-balance.  Take over-the-counter and prescription medicines only as told by your health care provider. These include any supplements.  Eat a healthy diet and maintain a healthy weight. A healthy diet includes low-fat dairy products, low-fat (lean) meats, and fiber from whole grains, beans, and lots of fruits and vegetables. Home safety  Remove any tripping hazards, such as rugs, cords, and clutter.  Install safety equipment such as grab bars in bathrooms and safety rails on stairs.  Keep rooms and walkways well-lit. Activity   Follow a regular exercise program to stay fit. This will help you maintain your balance. Ask your health care provider what types of exercise are appropriate for you.  If you need a cane or walker, use it as recommended by your health care provider.  Wear supportive shoes that have nonskid soles. Lifestyle  Do not drink alcohol if your health care provider tells you not to drink.  If you drink alcohol, limit how much you have: ? 0-1 drink a day for women. ? 0-2 drinks a day for men.  Be aware of how much alcohol is in your drink. In the [place] one drink equals one typical bottle of beer (12 oz), one-half glass of wine (5 oz), or one shot of hard liquor (1 oz).  Do not use any products that contain nicotine or tobacco, such as cigarettes and e-cigarettes. If you need help quitting, ask your health care provider. Summary  Having a healthy lifestyle and getting preventive care can help to protect your health and wellness after age 70.  Screening and testing are the best way to find a health problem early and help you avoid having a fall. Early diagnosis and treatment give you the best chance for managing medical conditions that are more common for people who are older than age 83.  Falls are a major cause of broken bones and head injuries in  people who are older than age 52. Take precautions to prevent a fall at home.  Work with your health care provider to learn what changes you can make to improve your health and wellness and to prevent falls. This information is not intended to replace advice given to you by your health care provider. Make sure you discuss any questions you have with your health care provider. Document Released: 06/22/2017 Document Revised: 11/30/2018 Document Reviewed: 06/22/2017 Elsevier Patient Education  2020 [employer].

## 2019-05-10 ENCOUNTER — Ambulatory Visit: Payer: Self-pay | Admitting: Nurse Practitioner

## 2019-05-10 ENCOUNTER — Telehealth: Payer: Self-pay | Admitting: Nurse Practitioner

## 2019-05-10 NOTE — Telephone Encounter (Signed)

## 2019-05-11 ENCOUNTER — Encounter: Payer: Self-pay | Admitting: Nurse Practitioner

## 2019-05-11 ENCOUNTER — Ambulatory Visit: Payer: Medicare Other | Admitting: Nurse Practitioner

## 2019-05-11 ENCOUNTER — Ambulatory Visit (INDEPENDENT_AMBULATORY_CARE_PROVIDER_SITE_OTHER): Payer: Medicare Other | Admitting: Nurse Practitioner

## 2019-05-11 ENCOUNTER — Other Ambulatory Visit: Payer: Self-pay

## 2019-05-11 VITALS — BP 138/80 | HR 59 | Temp 97.9°F | Ht 62.0 in | Wt 174.0 lb

## 2019-05-11 DIAGNOSIS — M545 Low back pain, unspecified: Secondary | ICD-10-CM

## 2019-05-11 DIAGNOSIS — R200 Anesthesia of skin: Secondary | ICD-10-CM | POA: Diagnosis not present

## 2019-05-11 DIAGNOSIS — E782 Mixed hyperlipidemia: Secondary | ICD-10-CM | POA: Diagnosis not present

## 2019-05-11 DIAGNOSIS — Z23 Encounter for immunization: Secondary | ICD-10-CM | POA: Diagnosis not present

## 2019-05-11 MED ORDER — ZOSTER VAC RECOMB ADJUVANTED 50 MCG/0.5ML IM SUSR: 0.5000 mL | Freq: Once | INTRAMUSCULAR | 0 refills | Status: AC

## 2019-05-11 NOTE — Patient Instructions (Addendum)
Stable CBC, BMP, and TSH  Normal urinalysis with negative urine culture  Abnormal lipid panel: low HDL and elevated LDL. She has a 15.6% chance of developing cardiovascular disease in the next 29yrs  I recommend use of atorvastatin. Let me know if she agrees  Continue use of ibuprofen as needed for back pain. If no improvement, will get back x-ray.

## 2019-05-11 NOTE — Progress Notes (Signed)
Subjective:  Patient ID: Heather Winters, female    DOB: December 12, 1945  Age: 73 y.o. MRN: [id number]  CC: Pain (lower back pain/ 7 days/ constant pain/ depends on movement/ toe numbness ( little toe on both feet)/ 2 mo/ wants flu shot/ wants to discuss shingrix )  Back Pain This is a new problem. The current episode started in the past 7 days. The problem is unchanged. The pain is present in the lumbar spine. The quality of the pain is described as aching and cramping. The pain does not radiate. The pain is the same all the time. The symptoms are aggravated by bending and lying down. Stiffness is present all day. Associated symptoms include paresthesias. Pertinent negatives include no abdominal pain, bladder incontinence, bowel incontinence, chest pain, dysuria, headaches, leg pain, numbness, paresis, pelvic pain, perianal numbness, tingling, weakness or weight loss. Risk factors include lack of exercise, menopause, poor posture and sedentary lifestyle. She has tried NSAIDs for the symptoms. The treatment provided moderate relief.   She also mentions bilateral 5th toe numbness x 55months. Denies any foot injury or use of tight or narrow shoes. States this is independent of back pain.  Reviewed past Medical, Social and Family history today.  Outpatient Medications Prior to Visit  Medication Sig Dispense Refill  . acetaminophen (TYLENOL) 500 MG tablet Take 1 tablet (500 mg total) by mouth every 6 (six) hours as needed. 30 tablet 0  . ibuprofen (ADVIL) 600 MG tablet Take 1 tablet (600 mg total) by mouth every 8 (eight) hours as needed. 15 tablet 0  . albuterol (PROVENTIL HFA;VENTOLIN HFA) 108 (90 Base) MCG/ACT inhaler Inhale 1-2 puffs into the lungs every 8 (eight) hours as needed for wheezing or shortness of breath. (Patient not taking: Reported on 05/11/2019) 1 Inhaler 0  . famotidine (PEPCID) 20 MG tablet Take 1 tablet (20 mg total) by mouth daily as needed for heartburn or indigestion. (Patient not  taking: Reported on 05/11/2019)    . HYDROcodone-acetaminophen (NORCO/VICODIN) 5-325 MG tablet Take 1 tablet by mouth every 4 (four) hours as needed for moderate pain. (Patient not taking: Reported on 05/11/2019) 15 tablet 0  . naproxen (NAPROSYN) 500 MG tablet Take 1 tablet (500 mg total) by mouth 2 (two) times daily with a meal. (Patient not taking: Reported on 05/11/2019) 10 tablet 0  . ondansetron (ZOFRAN ODT) 8 MG disintegrating tablet Take 1 tablet (8 mg total) by mouth every 8 (eight) hours as needed for nausea or vomiting. (Patient not taking: Reported on 05/09/2019) 10 tablet 0  . tamsulosin (FLOMAX) 0.4 MG CAPS capsule Take 1 capsule (0.4 mg total) by mouth daily. (Patient not taking: Reported on 05/09/2019) 10 capsule 0   No facility-administered medications prior to visit.     ROS See HPI  Objective:  BP 138/80   Pulse (!) 59   Temp 97.9 F (36.6 C) (Oral)   Ht 5\' 2"  (1.575 m)   Wt 174 lb (78.9 kg)   SpO2 98%   BMI 31.83 kg/m   BP Readings from Last 3 Encounters:  05/11/19 138/80  05/09/19 139/88  01/02/19 (!) 146/84    Wt Readings from Last 3 Encounters:  05/11/19 174 lb (78.9 kg)  05/09/19 173 lb 9.6 oz (78.7 kg)  01/02/19 170 lb (77.1 kg)    Physical Exam Cardiovascular:     Rate and Rhythm: Normal rate and regular rhythm.     Pulses: Normal pulses.          Dorsalis  pedis pulses are 2+ on the right side and 2+ on the left side.       Posterior tibial pulses are 2+ on the right side and 2+ on the left side.     Heart sounds: Normal heart sounds.  Pulmonary:     Effort: Pulmonary effort is normal.     Breath sounds: Normal breath sounds.  Abdominal:     Tenderness: There is no right CVA tenderness or left CVA tenderness.  Musculoskeletal:        General: No swelling, tenderness or deformity.     Right hip: Normal.     Left hip: Normal.     Right knee: Normal.     Left knee: Normal.     Right ankle: Normal.     Left ankle: Normal.     Lumbar back:  Normal.     Right upper leg: Normal.     Left upper leg: Normal.     Right lower leg: Normal. No edema.     Left lower leg: Normal. No edema.     Right foot: Normal. Normal range of motion.     Left foot: Normal. Normal range of motion.  Feet:     Right foot:     Skin integrity: Skin integrity normal.     Toenail Condition: Right toenails are normal.     Left foot:     Skin integrity: Skin integrity normal.     Toenail Condition: Left toenails are normal.     Comments: Equal and normal microfilament and vibration sensation Skin:    General: Skin is warm and dry.     Findings: No erythema or rash.  Neurological:     Mental Status: She is alert and oriented to person, place, and time.     Lab Results  Component Value Date   WBC 13.0 (H) 01/02/2019   HGB 13.9 01/02/2019   HCT 43.4 01/02/2019   PLT 215 01/02/2019   GLUCOSE 156 (H) 01/02/2019   CHOL 206 (H) 09/02/2017   TRIG 103.0 09/02/2017   HDL 53.00 09/02/2017   LDLCALC 132 (H) 09/02/2017   ALT 23 01/02/2019   AST 28 01/02/2019   NA 142 01/02/2019   K 3.2 (L) 01/02/2019   CL 109 01/02/2019   CREATININE 0.97 01/02/2019   BUN 15 01/02/2019   CO2 20 (L) 01/02/2019    Ct Renal Stone Study  Result Date: 01/02/2019 CLINICAL DATA:  Flank pain.  Left low pelvic pain since last night. EXAM: CT ABDOMEN AND PELVIS WITHOUT CONTRAST TECHNIQUE: Multidetector CT imaging of the abdomen and pelvis was performed following the standard protocol without IV contrast. COMPARISON:  None. FINDINGS: Lower chest: No acute abnormality. Hepatobiliary: No focal liver abnormality is seen. No gallstones, gallbladder wall thickening, or biliary dilatation. Pancreas: Unremarkable. No pancreatic ductal dilatation or surrounding inflammatory changes. Spleen: Normal in size without focal abnormality. Adrenals/Urinary Tract: The right kidney is unremarkable aside from a small 3-4 mm nonobstructing stone in the interpolar region. There is no right-sided  hydronephrosis. There is a 1.5 cm cyst in the upper pole of the left kidney. There is mild left-sided hydroureteronephrosis secondary to obstructing 2-3 mm stone in the proximal left ureter (axial series 2, image 43). There is a residual 1-2 mm stone in the lower pole of the left kidney. The bladder is unremarkable. There is no adrenal mass. Stomach/Bowel: Stomach is within normal limits. Appendix appears normal. No evidence of bowel wall thickening, distention, or inflammatory changes. Vascular/Lymphatic:  Aortic atherosclerosis. No enlarged abdominal or pelvic lymph nodes. Reproductive: Uterus and bilateral adnexa are unremarkable. There are probable fibroids at the uterine fundus. Other: No abdominal wall hernia or abnormality. No abdominopelvic ascites. Musculoskeletal: No acute or significant osseous findings. IMPRESSION: 1. Mild left-sided hydroureteronephrosis secondary to an obstructing 2-3 mm stone in the proximal left ureter. 2. Bilateral nephrolithiasis as detailed above. Electronically Signed   By: Constance Holster M.D.   On: 01/02/2019 14:37    Assessment & Plan:   Heather Winters was seen today for pain.  Diagnoses and all orders for this visit:  Acute bilateral low back pain without sciatica -     Urinalysis w microscopic + reflex cultur  Numbness of toes -     CBC -     Basic metabolic panel -     TSH  Need for shingles vaccine -     Zoster Vaccine Adjuvanted [facility]) injection; Inject 0.5 mLs into the muscle once for 1 dose.  Mixed hyperlipidemia -     Lipid panel  Need for influenza vaccination -     Flu Vaccine QUAD High Dose(Fluad)   I have discontinued Heather Winters's famotidine, albuterol, naproxen, ondansetron, HYDROcodone-acetaminophen, and tamsulosin. I am also having her start on Zoster Vaccine Adjuvanted. Additionally, I am having her maintain her acetaminophen and ibuprofen.  Meds ordered this encounter  Medications  . Zoster Vaccine Adjuvanted [facility]) injection     Sig: Inject 0.5 mLs into the muscle once for 1 dose.    Dispense:  0.5 mL    Refill:  0    Order Specific Question:   Supervising Provider    Answer:   MATTHEWS, CODY [[id number]]    Problem List Items Addressed This Visit    None    Visit Diagnoses    Acute bilateral low back pain without sciatica    -  Primary   Relevant Orders   Urinalysis w microscopic + reflex cultur   Numbness of toes       Relevant Orders   CBC   Basic metabolic panel   TSH   Need for shingles vaccine       Relevant Medications   Zoster Vaccine Adjuvanted [facility]) injection   Mixed hyperlipidemia       Relevant Orders   Lipid panel   Need for influenza vaccination       Relevant Orders   Flu Vaccine QUAD High Dose(Fluad) (Completed)      Follow-up: Return in about 4 weeks (around 06/08/2019) for AWV (wellness coach).  Wilfred Lacy, NP

## 2019-05-13 LAB — URINE CULTURE
MICRO NUMBER:: 901989
Result:: NO GROWTH
SPECIMEN QUALITY:: ADEQUATE

## 2019-05-13 LAB — BASIC METABOLIC PANEL
BUN: 18 mg/dL (ref 7–25)
CO2: 24 mmol/L (ref 20–32)
Calcium: 9.4 mg/dL (ref 8.6–10.4)
Chloride: 106 mmol/L (ref 98–110)
Creat: 0.87 mg/dL (ref 0.60–0.93)
Glucose, Bld: 80 mg/dL (ref 65–99)
Potassium: 4 mmol/L (ref 3.5–5.3)
Sodium: 142 mmol/L (ref 135–146)

## 2019-05-13 LAB — LIPID PANEL
Cholesterol: 196 mg/dL (ref <200)
HDL: 49 mg/dL — ABNORMAL LOW (ref >=50)
LDL Cholesterol (Calc): 125 mg/dL (calc) — ABNORMAL HIGH
Non-HDL Cholesterol (Calc): 147 mg/dL (calc) — ABNORMAL HIGH (ref <130)
Total CHOL/HDL Ratio: 4 (calc) (ref <5.0)
Triglycerides: 114 mg/dL (ref <150)

## 2019-05-13 LAB — CBC
HCT: 41.1 % (ref 35.0–45.0)
Hemoglobin: 13.1 g/dL (ref 11.7–15.5)
MCH: 26.9 pg — ABNORMAL LOW (ref 27.0–33.0)
MCHC: 31.9 g/dL — ABNORMAL LOW (ref 32.0–36.0)
MCV: 84.4 fL (ref 80.0–100.0)
MPV: 12.4 fL (ref 7.5–12.5)
Platelets: 215 10*3/uL (ref 140–400)
RBC: 4.87 10*6/uL (ref 3.80–5.10)
RDW: 12.8 % (ref 11.0–15.0)
WBC: 6.3 10*3/uL (ref 3.8–10.8)

## 2019-05-13 LAB — URINALYSIS W MICROSCOPIC + REFLEX CULTURE
Bacteria, UA: NONE SEEN /HPF
Bilirubin Urine: NEGATIVE
Glucose, UA: NEGATIVE
Hyaline Cast: NONE SEEN /LPF
Ketones, ur: NEGATIVE
Nitrites, Initial: NEGATIVE
Protein, ur: NEGATIVE
Specific Gravity, Urine: 1.021 (ref 1.001–1.03)
pH: 5.5 (ref 5.0–8.0)

## 2019-05-13 LAB — TSH: TSH: 1.48 mIU/L (ref 0.40–4.50)

## 2019-05-13 LAB — CULTURE INDICATED

## 2019-05-15 ENCOUNTER — Ambulatory Visit (HOSPITAL_BASED_OUTPATIENT_CLINIC_OR_DEPARTMENT_OTHER)
Admission: RE | Admit: 2019-05-15 | Discharge: 2019-05-15 | Disposition: A | Discharge status: Home / Self Care | Payer: Medicare Other | Source: Ambulatory Visit | Attending: Nurse Practitioner | Admitting: Nurse Practitioner

## 2019-05-15 ENCOUNTER — Other Ambulatory Visit: Payer: Self-pay

## 2019-05-15 ENCOUNTER — Encounter (HOSPITAL_BASED_OUTPATIENT_CLINIC_OR_DEPARTMENT_OTHER): Payer: Self-pay

## 2019-05-15 DIAGNOSIS — Z1231 Encounter for screening mammogram for malignant neoplasm of breast: Secondary | ICD-10-CM | POA: Insufficient documentation

## 2019-05-17 ENCOUNTER — Telehealth: Payer: Self-pay | Admitting: Nurse Practitioner

## 2019-05-17 DIAGNOSIS — E782 Mixed hyperlipidemia: Secondary | ICD-10-CM

## 2019-05-17 MED ORDER — ATORVASTATIN CALCIUM 40 MG PO TABS: 40.0000 mg | ORAL_TABLET | Freq: Every day | ORAL | 1 refills | Status: DC

## 2019-05-17 NOTE — Telephone Encounter (Signed)
rx sent

## 2019-05-17 NOTE — Telephone Encounter (Signed)
Pharmacy called for pt to follow up on refill   Atorvastatin,  Per lab notes: Pt agree to start atorvastatin, please send in rx to [facility].   Kristopher Oppenheim at [place], [place] - [place] [phone] (Phone) [phone] (Fax)

## 2019-05-17 NOTE — Telephone Encounter (Signed)
Pt is aware.  

## 2019-05-17 NOTE — Telephone Encounter (Signed)
[name] please advise, see lab result on 05/11/2019.

## 2019-07-12 DIAGNOSIS — N201 Calculus of ureter: Secondary | ICD-10-CM | POA: Diagnosis not present

## 2019-11-30 DIAGNOSIS — Z23 Encounter for immunization: Secondary | ICD-10-CM | POA: Diagnosis not present

## 2019-12-05 ENCOUNTER — Encounter: Payer: Self-pay | Admitting: Nurse Practitioner

## 2019-12-05 ENCOUNTER — Ambulatory Visit (INDEPENDENT_AMBULATORY_CARE_PROVIDER_SITE_OTHER): Payer: Medicare Other | Admitting: Nurse Practitioner

## 2019-12-05 ENCOUNTER — Other Ambulatory Visit: Payer: Self-pay

## 2019-12-05 VITALS — Wt 174.0 lb

## 2019-12-05 DIAGNOSIS — M545 Low back pain, unspecified: Secondary | ICD-10-CM

## 2019-12-05 MED ORDER — MELOXICAM 7.5 MG PO TABS: 7.5000 mg | ORAL_TABLET | Freq: Every day | ORAL | 0 refills | Status: DC

## 2019-12-05 NOTE — Progress Notes (Signed)
Virtual Visit via Video Note  I connected with@ on 12/05/19 at 12:30 PM EDT by a video enabled telemedicine application and verified that I am speaking with the correct person using two identifiers.  Location: Patient:Home Provider: Office Participants: patient and provider  I discussed the limitations of evaluation and management by telemedicine and the availability of in person appointments. I also discussed with the patient that there may be a patient responsible charge related to this service. The patient expressed understanding and agreed to proceed.  [id number] region around groin and hips both sides//pain has been there for month//pain is constant//worse when she has to urinate//no burning or frequency//pt will give bp at time of visit  History of Present Illness: Back Pain This is a new problem. The current episode started 1 to 4 weeks ago. The problem occurs constantly. The problem is unchanged. The pain is present in the lumbar spine. The quality of the pain is described as aching. The pain does not radiate. The pain is moderate. Worse during: worse in morning. The symptoms are aggravated by bending and twisting. Stiffness is present in the morning. Pertinent negatives include no abdominal pain, bladder incontinence, bowel incontinence, leg pain, numbness, paresis, paresthesias, perianal numbness, tingling, weakness or weight loss. Risk factors include poor posture, sedentary lifestyle, menopause and obesity. She has tried analgesics for the symptoms. The treatment provided significant relief.  improved with use of tylenol Denies any injury   Observations/Objective: Physical Exam  Constitutional: She is oriented to person, place, and time. No distress.  Pulmonary/Chest: Effort normal.  Abdominal: She exhibits no distension. There is no abdominal tenderness. There is no guarding.  Neurological: She is alert and oriented to person, place, and time.  Skin: No rash noted.   Assessment  and Plan: Nakida was seen today for groin pain.  Diagnoses and all orders for this visit:  Acute bilateral low back pain without sciatica -     meloxicam (MOBIC) 7.5 MG tablet; Take 1 tablet (7.5 mg total) by mouth daily. With food   Follow Up Instructions: See avs   I discussed the assessment and treatment plan with the patient. The patient was provided an opportunity to ask questions and all were answered. The patient agreed with the plan and demonstrated an understanding of the instructions.   The patient was advised to call back or seek an in-person evaluation if the symptoms worsen or if the condition fails to improve as anticipated.  Wilfred Lacy, NP

## 2019-12-05 NOTE — Patient Instructions (Signed)
Use meloxicam daily with food. Alternate between warm and cold compress as needed Schedule in office appt with symptoms worsen or do not improve in next 7-14days.   Acute Back Pain, Adult Acute back pain is sudden and usually short-lived. It is often caused by an injury to the muscles and tissues in the back. The injury may result from:  A muscle or ligament getting overstretched or torn (strained). Ligaments are tissues that connect bones to each other. Lifting something improperly can cause a back strain.  Wear and tear (degeneration) of the spinal disks. Spinal disks are circular tissue that provides cushioning between the bones of the spine (vertebrae).  Twisting motions, such as while playing sports or doing yard work.  A hit to the back.  Arthritis. You may have a physical exam, lab tests, and imaging tests to find the cause of your pain. Acute back pain usually goes away with rest and home care. Follow these instructions at home: Managing pain, stiffness, and swelling  Take over-the-counter and prescription medicines only as told by your health care provider.  Your health care provider may recommend applying ice during the first 24-48 hours after your pain starts. To do this: ? Put ice in a plastic bag. ? Place a towel between your skin and the bag. ? Leave the ice on for 20 minutes, 2-3 times a day.  If directed, apply heat to the affected area as often as told by your health care provider. Use the heat source that your health care provider recommends, such as a moist heat pack or a heating pad. ? Place a towel between your skin and the heat source. ? Leave the heat on for 20-30 minutes. ? Remove the heat if your skin turns bright red. This is especially important if you are unable to feel pain, heat, or cold. You have a greater risk of getting burned. Activity   Do not stay in bed. Staying in bed for more than 1-2 days can delay your recovery.  Sit up and stand up  straight. Avoid leaning forward when you sit, or hunching over when you stand. ? If you work at a desk, sit close to it so you do not need to lean over. Keep your chin tucked in. Keep your neck drawn back, and keep your elbows bent at a right angle. Your arms should look like the letter "L." ? Sit high and close to the steering wheel when you drive. Add lower back (lumbar) support to your car seat, if needed.  Take short walks on even surfaces as soon as you are able. Try to increase the length of time you walk each day.  Do not sit, drive, or stand in one place for more than 30 minutes at a time. Sitting or standing for long periods of time can put stress on your back.  Do not drive or use heavy machinery while taking prescription pain medicine.  Use proper lifting techniques. When you bend and lift, use positions that put less stress on your back: ? [place] your knees. ? Keep the load close to your body. ? Avoid twisting.  Exercise regularly as told by your health care provider. Exercising helps your back heal faster and helps prevent back injuries by keeping muscles strong and flexible.  Work with a physical therapist to make a safe exercise program, as recommended by your health care provider. Do any exercises as told by your physical therapist. Lifestyle  Maintain a healthy weight. Extra weight puts  stress on your back and makes it difficult to have good posture.  Avoid activities or situations that make you feel anxious or stressed. Stress and anxiety increase muscle tension and can make back pain worse. Learn ways to manage anxiety and stress, such as through exercise. General instructions  Sleep on a firm mattress in a comfortable position. Try lying on your side with your knees slightly bent. If you lie on your back, put a pillow under your knees.  Follow your treatment plan as told by your health care provider. This may include: ? Cognitive or behavioral therapy. ? Acupuncture or  massage therapy. ? Meditation or yoga. Contact a health care provider if:  You have pain that is not relieved with rest or medicine.  You have increasing pain going down into your legs or buttocks.  Your pain does not improve after 2 weeks.  You have pain at night.  You lose weight without trying.  You have a fever or chills. Get help right away if:  You develop new bowel or bladder control problems.  You have unusual weakness or numbness in your arms or legs.  You develop nausea or vomiting.  You develop abdominal pain.  You feel faint. Summary  Acute back pain is sudden and usually short-lived.  Use proper lifting techniques. When you bend and lift, use positions that put less stress on your back.  Take over-the-counter and prescription medicines and apply heat or ice as directed by your health care provider. This information is not intended to replace advice given to you by your health care provider. Make sure you discuss any questions you have with your health care provider. Document Revised: 11/28/2018 Document Reviewed: 03/23/2017 Elsevier Patient Education  [place].

## 2020-02-18 ENCOUNTER — Ambulatory Visit (INDEPENDENT_AMBULATORY_CARE_PROVIDER_SITE_OTHER): Payer: Medicare Other | Admitting: Nurse Practitioner

## 2020-02-18 ENCOUNTER — Other Ambulatory Visit: Payer: Self-pay

## 2020-02-18 ENCOUNTER — Encounter: Payer: Self-pay | Admitting: Nurse Practitioner

## 2020-02-18 VITALS — BP 128/72 | HR 62 | Temp 96.1°F | Ht 62.0 in | Wt 174.8 lb

## 2020-02-18 DIAGNOSIS — E782 Mixed hyperlipidemia: Secondary | ICD-10-CM

## 2020-02-18 DIAGNOSIS — M25552 Pain in left hip: Secondary | ICD-10-CM | POA: Diagnosis not present

## 2020-02-18 DIAGNOSIS — E1169 Type 2 diabetes mellitus with other specified complication: Secondary | ICD-10-CM | POA: Insufficient documentation

## 2020-02-18 DIAGNOSIS — M25551 Pain in right hip: Secondary | ICD-10-CM

## 2020-02-18 DIAGNOSIS — G8929 Other chronic pain: Secondary | ICD-10-CM | POA: Diagnosis not present

## 2020-02-18 LAB — LIPID PANEL
Cholesterol: 180 mg/dL (ref 0–200)
HDL: 48.2 mg/dL (ref >39.00)
LDL Cholesterol: 115 mg/dL — ABNORMAL HIGH (ref 0–99)
NonHDL: 131.5
Total CHOL/HDL Ratio: 4
Triglycerides: 81 mg/dL (ref 0.0–149.0)
VLDL: 16.2 mg/dL (ref 0.0–40.0)

## 2020-02-18 LAB — HEPATIC FUNCTION PANEL
ALT: 18 U/L (ref 0–35)
AST: 19 U/L (ref 0–37)
Albumin: 4.4 g/dL (ref 3.5–5.2)
Alkaline Phosphatase: 68 U/L (ref 39–117)
Bilirubin, Direct: 0.1 mg/dL (ref 0.0–0.3)
Total Bilirubin: 0.6 mg/dL (ref 0.2–1.2)
Total Protein: 6.9 g/dL (ref 6.0–8.3)

## 2020-02-18 LAB — BASIC METABOLIC PANEL
BUN: 11 mg/dL (ref 6–23)
CO2: 26 mEq/L (ref 19–32)
Calcium: 9.3 mg/dL (ref 8.4–10.5)
Chloride: 104 mEq/L (ref 96–112)
Creatinine, Ser: 0.69 mg/dL (ref 0.40–1.20)
GFR: 100.44 mL/min (ref >60.00)
Glucose, Bld: 99 mg/dL (ref 70–99)
Potassium: 3.7 mEq/L (ref 3.5–5.1)
Sodium: 139 mEq/L (ref 135–145)

## 2020-02-18 LAB — CK: Total CK: 143 U/L (ref 7–177)

## 2020-02-18 MED ORDER — NAPROXEN 500 MG PO TABS: 500.0000 mg | ORAL_TABLET | Freq: Two times a day (BID) | ORAL | 1 refills | Status: DC | PRN

## 2020-02-18 NOTE — Patient Instructions (Addendum)
Go to lab for lipid panel.  Hip Pain The hip is the joint between the upper legs and the lower pelvis. The bones, cartilage, tendons, and muscles of your hip joint support your body and allow you to move around. Hip pain can range from a minor ache to severe pain in one or both of your hips. The pain may be felt on the inside of the hip joint near the groin, or on the outside near the buttocks and upper thigh. You may also have swelling or stiffness in your hip area. Follow these instructions at home: Managing pain, stiffness, and swelling      If directed, put ice on the painful area. To do this: ? Put ice in a plastic bag. ? Place a towel between your skin and the bag. ? Leave the ice on for 20 minutes, 2-3 times a day.  If directed, apply heat to the affected area as often as told by your health care provider. Use the heat source that your health care provider recommends, such as a moist heat pack or a heating pad. ? Place a towel between your skin and the heat source. ? Leave the heat on for 20-30 minutes. ? Remove the heat if your skin turns bright red. This is especially important if you are unable to feel pain, heat, or cold. You may have a greater risk of getting burned. Activity  Do exercises as told by your health care provider.  Avoid activities that cause pain. General instructions   Take over-the-counter and prescription medicines only as told by your health care provider.  Keep a journal of your symptoms. Write down: ? How often you have hip pain. ? The location of your pain. ? What the pain feels like. ? What makes the pain worse.  Sleep with a pillow between your legs on your most comfortable side.  Keep all follow-up visits as told by your health care provider. This is important. Contact a health care provider if:  You cannot put weight on your leg.  Your pain or swelling continues or gets worse after one week.  It gets harder to walk.  You have a  fever. Get help right away if:  You fall.  You have a sudden increase in pain and swelling in your hip.  Your hip is red or swollen or very tender to touch. Summary  Hip pain can range from a minor ache to severe pain in one or both of your hips.  The pain may be felt on the inside of the hip joint near the groin, or on the outside near the buttocks and upper thigh.  Avoid activities that cause pain.  Write down how often you have hip pain, the location of the pain, what makes it worse, and what it feels like. This information is not intended to replace advice given to you by your health care provider. Make sure you discuss any questions you have with your health care provider. Document Revised: 12/25/2018 Document Reviewed: 12/25/2018 Elsevier Patient Education  [employer].   Hip Exercises Ask your health care provider which exercises are safe for you. Do exercises exactly as told by your health care provider and adjust them as directed. It is normal to feel mild stretching, pulling, tightness, or discomfort as you do these exercises. Stop right away if you feel sudden pain or your pain gets worse. Do not begin these exercises until told by your health care provider. Stretching and range-of-motion exercises These  exercises warm up your muscles and joints and improve the movement and flexibility of your hip. These exercises also help to relieve pain, numbness, and tingling. You may be asked to limit your range of motion if you had a hip replacement. Talk to your health care provider about these restrictions. Hamstrings, supine  1. Lie on your back (supine position). 2. Loop a belt or towel over the ball of your left / right foot. The ball of your foot is on the walking surface, right under your toes. 3. Straighten your left / right knee and slowly pull on the belt or towel to raise your leg until you feel a gentle stretch behind your knee (hamstring). ? Do not let your knee  bend while you do this. ? Keep your other leg flat on the floor. 4. Hold this position for __________ seconds. 5. Slowly return your leg to the starting position. Repeat __________ times. Complete this exercise __________ times a day. Hip rotation  1. Lie on your back on a firm surface. 2. With your left / right hand, gently pull your left / right knee toward the shoulder that is on the same side of the body. Stop when your knee is pointing toward the ceiling. 3. Hold your left / right ankle with your other hand. 4. Keeping your knee steady, gently pull your left / right ankle toward your other shoulder until you feel a stretch in your buttocks. ? Keep your hips and shoulders firmly planted while you do this stretch. 5. Hold this position for __________ seconds. Repeat __________ times. Complete this exercise __________ times a day. Seated stretch This exercise is sometimes called hamstrings and adductors stretch. 1. Sit on the floor with your legs stretched wide. Keep your knees straight during this exercise. 2. Keeping your head and back in a straight line, bend at your waist to reach for your left foot (position A). You should feel a stretch in your right inner thigh (adductors). 3. Hold this position for __________ seconds. Then slowly return to the upright position. 4. Keeping your head and back in a straight line, bend at your waist to reach forward (position B). You should feel a stretch behind both of your thighs and knees (hamstrings). 5. Hold this position for __________ seconds. Then slowly return to the upright position. 6. Keeping your head and back in a straight line, bend at your waist to reach for your right foot (position C). You should feel a stretch in your left inner thigh (adductors). 7. Hold this position for __________ seconds. Then slowly return to the upright position. Repeat __________ times. Complete this exercise __________ times a day. Lunge This exercise  stretches the muscles of the hip (hip flexors). 1. Place your left / right knee on the floor and bend your other knee so that is directly over your ankle. You should be half-kneeling. 2. Keep good posture with your head over your shoulders. 3. Tighten your buttocks to point your tailbone downward. This will prevent your back from arching too much. 4. You should feel a gentle stretch in the front of your left / right thigh and hip. If you do not feel a stretch, slide your other foot forward slightly and then slowly lunge forward with your chest up until your knee once again lines up over your ankle. ? Make sure your tailbone continues to point downward. 5. Hold this position for __________ seconds. 6. Slowly return to the starting position. Repeat __________ times. Complete this  exercise __________ times a day. Strengthening exercises These exercises build strength and endurance in your hip. Endurance is the ability to use your muscles for a long time, even after they get tired. Bridge This exercise strengthens the muscles of your hip (hip extensors). 1. Lie on your back on a firm surface with your knees bent and your feet flat on the floor. 2. Tighten your buttocks muscles and lift your bottom off the floor until the trunk of your body and your hips are level with your thighs. ? Do not arch your back. ? You should feel the muscles working in your buttocks and the back of your thighs. If you do not feel these muscles, slide your feet 1-2 inches (2.5-5 cm) farther away from your buttocks. 3. Hold this position for __________ seconds. 4. Slowly lower your hips to the starting position. 5. Let your muscles relax completely between repetitions. Repeat __________ times. Complete this exercise __________ times a day. Straight leg raises, side-lying This exercise strengthens the muscles that move the hip joint away from the center of the body (hip abductors). 1. Lie on your side with your left / right  leg in the top position. Lie so your head, shoulder, hip, and knee line up. You may bend your bottom knee slightly to help you balance. 2. Roll your hips slightly forward, so your hips are stacked directly over each other and your left / right knee is facing forward. 3. Leading with your heel, lift your top leg 4-6 inches (10-15 cm). You should feel the muscles in your top hip lifting. ? Do not let your foot drift forward. ? Do not let your knee roll toward the ceiling. 4. Hold this position for __________ seconds. 5. Slowly return to the starting position. 6. Let your muscles relax completely between repetitions. Repeat __________ times. Complete this exercise __________ times a day. Straight leg raises, side-lying This exercise strengthens the muscles that move the hip joint toward the center of the body (hip adductors). 1. Lie on your side with your left / right leg in the bottom position. Lie so your head, shoulder, hip, and knee line up. You may place your upper foot in front to help you balance. 2. Roll your hips slightly forward, so your hips are stacked directly over each other and your left / right knee is facing forward. 3. Tense the muscles in your inner thigh and lift your bottom leg 4-6 inches (10-15 cm). 4. Hold this position for __________ seconds. 5. Slowly return to the starting position. 6. Let your muscles relax completely between repetitions. Repeat __________ times. Complete this exercise __________ times a day. Straight leg raises, supine This exercise strengthens the muscles in the front of your thigh (quadriceps). 1. Lie on your back (supine position) with your left / right leg extended and your other knee bent. 2. Tense the muscles in the front of your left / right thigh. You should see your kneecap slide up or see increased dimpling just above your knee. 3. Keep these muscles tight as you raise your leg 4-6 inches (10-15 cm) off the floor. Do not let your knee  bend. 4. Hold this position for __________ seconds. 5. Keep these muscles tense as you lower your leg. 6. Relax the muscles slowly and completely between repetitions. Repeat __________ times. Complete this exercise __________ times a day. Hip abductors, standing This exercise strengthens the muscles that move the leg and hip joint away from the center of the body (hip  abductors). 1. Tie one end of a rubber exercise band or tubing to a secure surface, such as a chair, table, or pole. 2. Loop the other end of the band or tubing around your left / right ankle. 3. Keeping your ankle with the band or tubing directly opposite the secured end, step away until there is tension in the tubing or band. Hold on to a chair, table, or pole as needed for balance. 4. Lift your left / right leg out to your side. While you do this: ? Keep your back upright. ? Keep your shoulders over your hips. ? Keep your toes pointing forward. ? Make sure to use your hip muscles to slowly lift your leg. Do not tip your body or forcefully lift your leg. 5. Hold this position for __________ seconds. 6. Slowly return to the starting position. Repeat __________ times. Complete this exercise __________ times a day. Squats This exercise strengthens the muscles in the front of your thigh (quadriceps). 1. Stand in a door frame so your feet and knees are in line with the frame. You may place your hands on the frame for balance. 2. Slowly bend your knees and lower your hips like you are going to sit in a chair. ? Keep your lower legs in a straight-up-and-down position. ? Do not let your hips go lower than your knees. ? Do not bend your knees lower than told by your health care provider. ? If your hip pain increases, do not bend as low. 3. Hold this position for ___________ seconds. 4. Slowly push with your legs to return to standing. Do not use your hands to pull yourself to standing. Repeat __________ times. Complete this  exercise __________ times a day. This information is not intended to replace advice given to you by your health care provider. Make sure you discuss any questions you have with your health care provider. Document Revised: 03/15/2019 Document Reviewed: 06/20/2018 Elsevier Patient Education  [employer].

## 2020-02-18 NOTE — Progress Notes (Signed)
Subjective:  Patient ID: Golden Circle, female    DOB: 1946-05-26  Age: 74 y.o. MRN: [id number]  CC: Pain (in her hips and an groin area sorta where legs join//pt states been going on for awhile//states her last few visit been on this pain-meloxicam didn't work)  Hip Pain  There was no injury mechanism. The pain is present in the left hip and right hip. The quality of the pain is described as aching. The pain has been fluctuating since onset. Pertinent negatives include no inability to bear weight, loss of motion, loss of sensation, muscle weakness, numbness or tingling. She reports no foreign bodies present. The symptoms are aggravated by movement, weight bearing and palpation. She has tried NSAIDs for the symptoms. The treatment provided moderate relief.    Hyperlipidemia: No myalgia with lipitor LDL at goal. ASCVD risk of 13.6%.  Reviewed past Medical, Social and Family history today.  Outpatient Medications Prior to Visit  Medication Sig Dispense Refill  . atorvastatin (LIPITOR) 40 MG tablet Take 1 tablet (40 mg total) by mouth daily at 6 PM. 90 tablet 1  . meloxicam (MOBIC) 7.5 MG tablet Take 1 tablet (7.5 mg total) by mouth daily. With food 14 tablet 0   No facility-administered medications prior to visit.    ROS See HPI  Objective:  BP 128/72   Pulse 62   Temp (!) 96.1 F (35.6 C) (Tympanic)   Ht 5\' 2"  (1.575 m)   Wt 174 lb 12.8 oz (79.3 kg)   SpO2 99%   BMI 31.97 kg/m   BP Readings from Last 3 Encounters:  02/18/20 128/72  05/11/19 138/80  05/09/19 139/88   Wt Readings from Last 3 Encounters:  02/18/20 174 lb 12.8 oz (79.3 kg)  12/05/19 174 lb (78.9 kg)  05/11/19 174 lb (78.9 kg)   Physical Exam Vitals reviewed.  Cardiovascular:     Rate and Rhythm: Normal rate.     Pulses: Normal pulses.  Pulmonary:     Effort: Pulmonary effort is normal.  Musculoskeletal:     Lumbar back: Tenderness present. No bony tenderness. Normal range of motion. Negative right  straight leg raise test and negative left straight leg raise test.     Right hip: Normal.     Left hip: Normal.     Right upper leg: Normal.     Left upper leg: Normal.     Right lower leg: Normal. No edema.     Left lower leg: Normal. No edema.  Skin:    Findings: No erythema or rash.  Neurological:     Mental Status: She is alert and oriented to person, place, and time.  Psychiatric:        Mood and Affect: Mood normal.        Behavior: Behavior normal.        Thought Content: Thought content normal.    Lab Results  Component Value Date   WBC 6.3 05/11/2019   HGB 13.1 05/11/2019   HCT 41.1 05/11/2019   PLT 215 05/11/2019   GLUCOSE 99 02/18/2020   CHOL 180 02/18/2020   TRIG 81.0 02/18/2020   HDL 48.20 02/18/2020   LDLCALC 115 (H) 02/18/2020   ALT 18 02/18/2020   AST 19 02/18/2020   NA 139 02/18/2020   K 3.7 02/18/2020   CL 104 02/18/2020   CREATININE 0.69 02/18/2020   BUN 11 02/18/2020   CO2 26 02/18/2020   TSH 1.48 05/11/2019   Assessment & Plan:  This  visit occurred during the SARS-CoV-2 public health emergency.  Safety protocols were in place, including screening questions prior to the visit, additional usage of staff PPE, and extensive cleaning of exam room while observing appropriate contact time as indicated for disinfecting solutions.   Paden was seen today for pain.  Diagnoses and all orders for this visit:  Chronic pain of both hips -     naproxen (NAPROSYN) 500 MG tablet; Take 1 tablet (500 mg total) by mouth every 12 (twelve) hours as needed. -     Basic metabolic panel  Mixed hyperlipidemia -     Lipid panel -     Hepatic function panel -     CK (Creatine Kinase) -     atorvastatin (LIPITOR) 40 MG tablet; Take 1 tablet (40 mg total) by mouth daily at 6 PM.   I have discontinued Macky Lower. Zimmers's meloxicam. I am also having her start on naproxen. Additionally, I am having her maintain her atorvastatin.  Meds ordered this encounter  Medications  .  naproxen (NAPROSYN) 500 MG tablet    Sig: Take 1 tablet (500 mg total) by mouth every 12 (twelve) hours as needed.    Dispense:  60 tablet    Refill:  1    Order Specific Question:   Supervising Provider    Answer:   Ronnald Nian [id number]  . atorvastatin (LIPITOR) 40 MG tablet    Sig: Take 1 tablet (40 mg total) by mouth daily at 6 PM.    Dispense:  90 tablet    Refill:  3    Order Specific Question:   Supervising Provider    Answer:   UNDREA, ARCHBOLD [[id number]]   Problem List Items Addressed This Visit      Other   Chronic pain of both hips - Primary   Relevant Medications   naproxen (NAPROSYN) 500 MG tablet   Other Relevant Orders   Basic metabolic panel (Completed)   Mixed hyperlipidemia   Relevant Medications   atorvastatin (LIPITOR) 40 MG tablet   Other Relevant Orders   Lipid panel (Completed)   Hepatic function panel (Completed)   CK (Creatine Kinase) (Completed)      Follow-up: Return if symptoms worsen or fail to improve.  Wilfred Lacy, NP

## 2020-02-21 MED ORDER — ATORVASTATIN CALCIUM 40 MG PO TABS: 40.0000 mg | ORAL_TABLET | Freq: Every day | ORAL | 3 refills | Status: DC

## 2020-05-01 ENCOUNTER — Telehealth: Payer: Self-pay | Admitting: Nurse Practitioner

## 2020-05-01 NOTE — Telephone Encounter (Signed)
Left message for patient to schedule Annual Wellness Visit.  Please schedule with Nurse Health Advisor Martha Stanley, RN at [facility]  °

## 2020-05-20 ENCOUNTER — Ambulatory Visit (INDEPENDENT_AMBULATORY_CARE_PROVIDER_SITE_OTHER): Payer: Medicare Other

## 2020-05-20 ENCOUNTER — Telehealth: Payer: Self-pay

## 2020-05-20 VITALS — Ht 62.0 in | Wt 168.8 lb

## 2020-05-20 DIAGNOSIS — Z780 Asymptomatic menopausal state: Secondary | ICD-10-CM | POA: Diagnosis not present

## 2020-05-20 DIAGNOSIS — Z1211 Encounter for screening for malignant neoplasm of colon: Secondary | ICD-10-CM

## 2020-05-20 DIAGNOSIS — Z0000 Encounter for general adult medical examination without abnormal findings: Secondary | ICD-10-CM | POA: Diagnosis not present

## 2020-05-20 DIAGNOSIS — Z1231 Encounter for screening mammogram for malignant neoplasm of breast: Secondary | ICD-10-CM

## 2020-05-20 NOTE — Progress Notes (Addendum)
Subjective:   Heather Winters is a 74 y.o. female who presents for Medicare Annual (Subsequent) preventive examination.  I connected with Nalani today by telephone and verified that I am speaking with the correct person using two identifiers. Location patient: home Location provider: work Persons participating in the virtual visit: patient, Marine scientist.    I discussed the limitations, risks, security and privacy concerns of performing an evaluation and management service by telephone and the availability of in person appointments. I also discussed with the patient that there may be a patient responsible charge related to this service. The patient expressed understanding and verbally consented to this telephonic visit.    Interactive audio and video telecommunications were attempted between this provider and patient, however failed, due to patient having technical difficulties OR patient did not have access to video capability.  We continued and completed visit with audio only.  Some vital signs may be absent or patient reported.   Time Spent with patient on telephone encounter: 30 minutes  Review of Systems     Cardiac Risk Factors include: advanced age (>64men, >78 women);obesity (BMI >30kg/m2);sedentary lifestyle;dyslipidemia     Objective:    Today's Vitals   05/20/20 0859  Weight: 168 lb 12.8 oz (76.6 kg)  Height: 5\' 2"  (1.575 m)   Body mass index is 30.87 kg/m.  Advanced Directives 05/20/2020 05/09/2019 01/02/2019 02/22/2018  Does Patient Have a Medical Advance Directive? No No No No  Would patient like information on creating a medical advance directive? No - Patient declined No - Patient declined No - Guardian declined No - Patient declined    Current Medications (verified) Outpatient Encounter Medications as of 05/20/2020  Medication Sig  . naproxen (NAPROSYN) 500 MG tablet Take 1 tablet (500 mg total) by mouth every 12 (twelve) hours as needed.  Marland Kitchen atorvastatin (LIPITOR) 40 MG  tablet Take 1 tablet (40 mg total) by mouth daily at 6 PM. (Patient not taking: Reported on 05/20/2020)   No facility-administered encounter medications on file as of 05/20/2020.    Allergies (verified) Patient has no known allergies.   History: Past Medical History:  Diagnosis Date  . Arthritis   . History of fainting spells of unknown cause    History reviewed. No pertinent surgical history. Family History  Problem Relation Age of Onset  . Diabetes Mother   . Diabetes Brother   . Diabetes Brother   . Diabetes Brother   . COPD Father   . Heart disease Neg Hx    Social History   Socioeconomic History  . Marital status: Widowed    Spouse name: Not on file  . Number of children: Not on file  . Years of education: Not on file  . Highest education level: Not on file  Occupational History  . Occupation: retired  Tobacco Use  . Smoking status: Current Some Day Smoker    Packs/day: 0.25    Years: 35.00    Pack years: 8.75    Types: Cigarettes  . Smokeless tobacco: Never Used  . Tobacco comment: pt states she has all the info  Vaping Use  . Vaping Use: Never used  Substance and Sexual Activity  . Alcohol use: No  . Drug use: No  . Sexual activity: Not Currently  Other Topics Concern  . Not on file  Social History Narrative  . Not on file   Social Determinants of Health   Financial Resource Strain: Low Risk   . Difficulty of Paying Living Expenses:  Not hard at all  Food Insecurity: No Food Insecurity  . Worried About Charity fundraiser in the Last Year: Never true  . Ran Out of Food in the Last Year: Never true  Transportation Needs: No Transportation Needs  . Lack of Transportation (Medical): No  . Lack of Transportation (Non-Medical): No  Physical Activity: Inactive  . Days of Exercise per Week: 0 days  . Minutes of Exercise per Session: 0 min  Stress: No Stress Concern Present  . Feeling of Stress : Not at all  Social Connections: Moderately Integrated  .  Frequency of Communication with Friends and Family: More than three times a week  . Frequency of Social Gatherings with Friends and Family: Once a week  . Attends Religious Services: 1 to 4 times per year  . Active Member of Clubs or Organizations: Yes  . Attends Archivist Meetings: 1 to 4 times per year  . Marital Status: Widowed    Tobacco Counseling Ready to quit: Not Answered Counseling given: Not Answered Comment: pt states she has all the info   Clinical Intake:  Pre-visit preparation completed: No  Pain : No/denies pain     Nutritional Status: BMI > 30  Obese Diabetes: No  How often do you need to have someone help you when you read instructions, pamphlets, or other written materials from your doctor or pharmacy?: 1 - Never What is the last grade level you completed in school?: junior college  Diabetic?No  Interpreter Needed?: No  Information entered by :: Caroleen Hamman LPN   Activities of Daily Living In your present state of health, do you have any difficulty performing the following activities: 05/20/2020  Hearing? Y  Comment mild  Vision? N  Difficulty concentrating or making decisions? Y  Comment occasional  Walking or climbing stairs? N  Dressing or bathing? N  Doing errands, shopping? N  Preparing Food and eating ? N  Using the Toilet? N  In the past six months, have you accidently leaked urine? N  Do you have problems with loss of bowel control? N  Managing your Medications? N  Managing your Finances? N  Housekeeping or managing your Housekeeping? N  Some recent data might be hidden    Patient Care Team: Nche, Charlene Brooke, NP as PCP - General (Internal Medicine) End, Harrell Gave, MD as PCP - Cardiology (Cardiology)  Indicate any recent Medical Services you may have received from other than [facility] providers in the past year (date may be approximate).     Assessment:   This is a routine wellness examination for  [facility]  Hearing/Vision screen  Hearing Screening   125Hz  250Hz  500Hz  1000Hz  2000Hz  3000Hz  4000Hz  6000Hz  8000Hz   Right ear:           Left ear:           Comments: Mild hearing loss  Vision Screening Comments: Wears glasses for driving Last eye [id number]  Dietary issues and exercise activities discussed: Current Exercise Habits: The patient does not participate in regular exercise at present, Exercise limited by: None identified  Goals    . DIET - INCREASE WATER INTAKE    . Increase physical activity    . Patient Stated     Eat healthier      Depression Screen PHQ 2/9 Scores 05/20/2020 05/09/2019 02/22/2018 09/02/2017  PHQ - 2 Score 1 0 1 6    Fall Risk Fall Risk  05/20/2020 05/09/2019 02/22/2018 09/02/2017  Falls in the  past year? 0 0 No No  Number falls in past yr: 0 - - -  Injury with Fall? 0 - - -  Follow up Falls prevention discussed - - -    Any stairs in or around the home? No  Home free of loose throw rugs in walkways, pet beds, electrical cords, etc? No  Patient advised of trip hazards Adequate lighting in your home to reduce risk of falls? Yes   ASSISTIVE DEVICES UTILIZED TO PREVENT FALLS:  Life alert? No  Use of a cane, walker or w/c? No  Grab bars in the bathroom? No  Shower chair or bench in shower? No  Elevated toilet seat or a handicapped toilet? No   TIMED UP AND GO:  Was the test performed? No . Telephonic visit   Cognitive Function:Patient plays word search games & crossword puzzles for brain health. She says she has noticed some mild memory changes like forgetting what people tell her sometimes. I offered to make an appt to see PCP but patient declined appt at this time. She says she will call back if symptoms worsen.     6CIT Screen 05/20/2020  What Year? 0 points  What month? 0 points  What time? 0 points  Count back from 20 0 points  Months in reverse 2 points  Repeat phrase 2 points  Total Score 4    Immunizations Immunization  History  Administered Date(s) Administered  . Fluad Quad(high Dose 65+) 05/11/2019  . Influenza, High Dose Seasonal PF 07/05/2018  . Janssen (J&J) SARS-COV-2 Vaccination 11/22/2019    TDAP status: Due, Education has been provided regarding the importance of this vaccine. Advised may receive this vaccine at local pharmacy or Health Dept. Aware to provide a copy of the vaccination record if obtained from local pharmacy or Health Dept. Verbalized acceptance and understanding. Telephonic visit  Flu Vaccine status: Declined, Education has been provided regarding the importance of this vaccine but patient still declined. Advised may receive this vaccine at local pharmacy or Health Dept. Aware to provide a copy of the vaccination record if obtained from local pharmacy or Health Dept. Verbalized acceptance and understanding. Telephonic visit  Pneumococcal vaccine status: Declined,  Education has been provided regarding the importance of this vaccine but patient still declined. Advised may receive this vaccine at local pharmacy or Health Dept. Aware to provide a copy of the vaccination record if obtained from local pharmacy or Health Dept. Verbalized acceptance and understanding.  Telephonic visit  Covid-19 vaccine status: Completed vaccines  Qualifies for Shingles Vaccine? Yes   Zostavax completed No   Shingrix Completed?: No.    Education has been provided regarding the importance of this vaccine. Patient has been advised to call insurance company to determine out of pocket expense if they have not yet received this vaccine. Advised may also receive vaccine at local pharmacy or Health Dept. Verbalized acceptance and understanding.  Screening Tests Health Maintenance  Topic Date Due  . Hepatitis C Screening  Never done  . TETANUS/TDAP  Never done  . COLONOSCOPY  Never done  . PNA vac Low Risk Adult (1 of 2 - PCV13) Never done  . INFLUENZA VACCINE  03/23/2020  . MAMMOGRAM  05/14/2021  . DEXA SCAN   Completed  . COVID-19 Vaccine  Completed    Health Maintenance  Health Maintenance Due  Topic Date Due  . Hepatitis C Screening  Never done  . TETANUS/TDAP  Never done  . COLONOSCOPY  Never done  .  PNA vac Low Risk Adult (1 of 2 - PCV13) Never done  . INFLUENZA VACCINE  03/23/2020    Colorectal cancer screening: Referral to GI placed today. Pt aware the office will call re: appt.   Mammogram status: Ordered today. Pt provided with contact info and advised to call to schedule appt.    Bone Density status: Ordered today. Pt provided with contact info and advised to call to schedule appt.  Lung Cancer Screening: (Low Dose CT Chest recommended if Age 86-80 years, 30 pack-year currently smoking OR have quit w/in 15years.) does not qualify.     Additional Screening:  Hepatitis C Screening: does qualify; Discuss with PCP  Vision Screening: Recommended annual ophthalmology exams for early detection of glaucoma and other disorders of the eye. Is the patient up to date with their annual eye exam?  No  Who is the provider or what is the name of the office in which the patient attends annual eye exams? [facility] Patient plans to make an appt soon.  Dental Screening: Recommended annual dental exams for proper oral hygiene  Community Resource Referral / Chronic Care Management: CRR required this visit?  No   CCM required this visit?  No      Plan:     I have personally reviewed and noted the following in the patient's chart:   . Medical and social history . Use of alcohol, tobacco or illicit drugs  . Current medications and supplements . Functional ability and status . Nutritional status . Physical activity . Advanced directives . List of other physicians . Hospitalizations, surgeries, and ER visits in previous 12 months . Vitals . Screenings to include cognitive, depression, and falls . Referrals and appointments  In addition, I have reviewed and discussed with patient  certain preventive protocols, quality metrics, and best practice recommendations. A written personalized care plan for preventive services as well as general preventive health recommendations were provided to patient.    Due to this being a telephonic visit, the after visit summary with patients personalized plan was offered to patient via mail or my-chart. Patient would like to access on my-chart.   Marta Antu, LPN   12/06/6061  Nurse Health Advisor  Nurse Notes: Patient states she has been having intermittent abdominal pain. Denies nausea, vomiting, diarrhea and constipation. Offered several times to make an appt to be seen but patient refused at this time. She will call back for appt if symptoms persist. She also needs refills of Atorvastatin. Message sent to PCP.

## 2020-05-20 NOTE — Telephone Encounter (Signed)
Patient requesting refills of Atorvastatin. Uses [facility].

## 2020-05-20 NOTE — Patient Instructions (Signed)
Ms. Heather Winters , Thank you for taking time to complete your Medicare Wellness Visit. I appreciate your ongoing commitment to your health goals. Please review the following plan we discussed and let me know if I can assist you in the future.   Screening recommendations/referrals: Colonoscopy: Ordered today. Someone will be calling you to schedule. Mammogram: Ordered today. Someone will be calling you to schedule. Bone Density: Ordered today. Someone will be calling you to schedule. Recommended yearly ophthalmology/optometry visit for glaucoma screening and checkup Recommended yearly dental visit for hygiene and checkup  Vaccinations: Influenza vaccine: Due-May obtain vaccine at our office or at your local pharmacy. Pneumococcal vaccine: Due-May obtain vaccine at our office or at your local pharmacy. Tdap vaccine: Discuss with pharmacy Shingles vaccine: Discuss with pharmacy Covid-19:Completed vaccines  Advanced directives: Discussed. Declined information today.  Conditions/risks identified: See problem list. Please call the office to schedule an appointment with Heather Winters if Abdominal pain persist.  Next appointment: Follow up in one year for your annual wellness visit    Preventive Care 65 Years and Older, Female Preventive care refers to lifestyle choices and visits with your health care provider that can promote health and wellness. What does preventive care include?  A yearly physical exam. This is also called an annual well check.  Dental exams once or twice a year.  Routine eye exams. Ask your health care provider how often you should have your eyes checked.  Personal lifestyle choices, including:  Daily care of your teeth and gums.  Regular physical activity.  Eating a healthy diet.  Avoiding tobacco and drug use.  Limiting alcohol use.  Practicing safe sex.  Taking low-dose aspirin every day.  Taking vitamin and mineral supplements as recommended by your health  care provider. What happens during an annual well check? The services and screenings done by your health care provider during your annual well check will depend on your age, overall health, lifestyle risk factors, and family history of disease. Counseling  Your health care provider may ask you questions about your:  Alcohol use.  Tobacco use.  Drug use.  Emotional well-being.  Home and relationship well-being.  Sexual activity.  Eating habits.  History of falls.  Memory and ability to understand (cognition).  Work and work Statistician.  Reproductive health. Screening  You may have the following tests or measurements:  Height, weight, and BMI.  Blood pressure.  Lipid and cholesterol levels. These may be checked every 5 years, or more frequently if you are over 47 years old.  Skin check.  Lung cancer screening. You may have this screening every year starting at age 2 if you have a 30-pack-year history of smoking and currently smoke or have quit within the past 15 years.  Fecal occult blood test (FOBT) of the stool. You may have this test every year starting at age 71.  Flexible sigmoidoscopy or colonoscopy. You may have a sigmoidoscopy every 5 years or a colonoscopy every 10 years starting at age 56.  Hepatitis C blood test.  Hepatitis B blood test.  Sexually transmitted disease (STD) testing.  Diabetes screening. This is done by checking your blood sugar (glucose) after you have not eaten for a while (fasting). You may have this done every 1-3 years.  Bone density scan. This is done to screen for osteoporosis. You may have this done starting at age 27.  Mammogram. This may be done every 1-2 years. Talk to your health care provider about how often you should have regular  mammograms. Talk with your health care provider about your test results, treatment options, and if necessary, the need for more tests. Vaccines  Your health care provider may recommend certain  vaccines, such as:  Influenza vaccine. This is recommended every year.  Tetanus, diphtheria, and acellular pertussis (Tdap, Td) vaccine. You may need a Td booster every 10 years.  Zoster vaccine. You may need this after age 59.  Pneumococcal 13-valent conjugate (PCV13) vaccine. One dose is recommended after age 39.  Pneumococcal polysaccharide (PPSV23) vaccine. One dose is recommended after age 30. Talk to your health care provider about which screenings and vaccines you need and how often you need them. This information is not intended to replace advice given to you by your health care provider. Make sure you discuss any questions you have with your health care provider. Document Released: 09/05/2015 Document Revised: 04/28/2016 Document Reviewed: 06/10/2015 Elsevier Interactive Patient Education  2017 [place] Prevention in the Home Falls can cause injuries. They can happen to people of all ages. There are many things you can do to make your home safe and to help prevent falls. What can I do on the outside of my home?  Regularly fix the edges of walkways and driveways and fix any cracks.  Remove anything that might make you trip as you walk through a door, such as a raised step or threshold.  Trim any bushes or trees on the path to your home.  Use bright outdoor lighting.  Clear any walking paths of anything that might make someone trip, such as rocks or tools.  Regularly check to see if handrails are loose or broken. Make sure that both sides of any steps have handrails.  Any raised decks and porches should have guardrails on the edges.  Have any leaves, snow, or ice cleared regularly.  Use sand or salt on walking paths during winter.  Clean up any spills in your garage right away. This includes oil or grease spills. What can I do in the bathroom?  Use night lights.  Install grab bars by the toilet and in the tub and shower. Do not use towel bars as grab  bars.  Use non-skid mats or decals in the tub or shower.  If you need to sit down in the shower, use a plastic, non-slip stool.  Keep the floor dry. Clean up any water that spills on the floor as soon as it happens.  Remove soap buildup in the tub or shower regularly.  Attach bath mats securely with double-sided non-slip rug tape.  Do not have throw rugs and other things on the floor that can make you trip. What can I do in the bedroom?  Use night lights.  Make sure that you have a light by your bed that is easy to reach.  Do not use any sheets or blankets that are too big for your bed. They should not hang down onto the floor.  Have a firm chair that has side arms. You can use this for support while you get dressed.  Do not have throw rugs and other things on the floor that can make you trip. What can I do in the kitchen?  Clean up any spills right away.  Avoid walking on wet floors.  Keep items that you use a lot in easy-to-reach places.  If you need to reach something above you, use a strong step stool that has a grab bar.  Keep electrical cords out of the way.  Do not use floor polish or wax that makes floors slippery. If you must use wax, use non-skid floor wax.  Do not have throw rugs and other things on the floor that can make you trip. What can I do with my stairs?  Do not leave any items on the stairs.  Make sure that there are handrails on both sides of the stairs and use them. Fix handrails that are broken or loose. Make sure that handrails are as long as the stairways.  Check any carpeting to make sure that it is firmly attached to the stairs. Fix any carpet that is loose or worn.  Avoid having throw rugs at the top or bottom of the stairs. If you do have throw rugs, attach them to the floor with carpet tape.  Make sure that you have a light switch at the top of the stairs and the bottom of the stairs. If you do not have them, ask someone to add them for  you. What else can I do to help prevent falls?  Wear shoes that:  Do not have high heels.  Have rubber bottoms.  Are comfortable and fit you well.  Are closed at the toe. Do not wear sandals.  If you use a stepladder:  Make sure that it is fully opened. Do not climb a closed stepladder.  Make sure that both sides of the stepladder are locked into place.  Ask someone to hold it for you, if possible.  Clearly mark and make sure that you can see:  Any grab bars or handrails.  First and last steps.  Where the edge of each step is.  Use tools that help you move around (mobility aids) if they are needed. These include:  Canes.  Walkers.  Scooters.  Crutches.  Turn on the lights when you go into a dark area. Replace any light bulbs as soon as they burn out.  Set up your furniture so you have a clear path. Avoid moving your furniture around.  If any of your floors are uneven, fix them.  If there are any pets around you, be aware of where they are.  Review your medicines with your doctor. Some medicines can make you feel dizzy. This can increase your chance of falling. Ask your doctor what other things that you can do to help prevent falls. This information is not intended to replace advice given to you by your health care provider. Make sure you discuss any questions you have with your health care provider. Document Released: 06/05/2009 Document Revised: 01/15/2016 Document Reviewed: 09/13/2014 Elsevier Interactive Patient Education  2017 [employer].

## 2020-05-21 NOTE — Telephone Encounter (Signed)
Rx was sent 02/21/2020, #90 with 3refills

## 2020-05-22 NOTE — Telephone Encounter (Signed)
Patient notified

## 2020-05-26 DIAGNOSIS — Z1379 Encounter for other screening for genetic and chromosomal anomalies: Secondary | ICD-10-CM | POA: Diagnosis not present

## 2020-05-26 DIAGNOSIS — E7800 Pure hypercholesterolemia, unspecified: Secondary | ICD-10-CM | POA: Diagnosis not present

## 2020-05-26 DIAGNOSIS — R0602 Shortness of breath: Secondary | ICD-10-CM | POA: Diagnosis not present

## 2020-05-26 DIAGNOSIS — Z8249 Family history of ischemic heart disease and other diseases of the circulatory system: Secondary | ICD-10-CM | POA: Diagnosis not present

## 2020-05-26 DIAGNOSIS — I10 Essential (primary) hypertension: Secondary | ICD-10-CM | POA: Diagnosis not present

## 2020-05-29 ENCOUNTER — Encounter: Payer: Self-pay | Admitting: Gastroenterology

## 2020-05-29 DIAGNOSIS — Z1379 Encounter for other screening for genetic and chromosomal anomalies: Secondary | ICD-10-CM | POA: Diagnosis not present

## 2020-05-29 DIAGNOSIS — R0602 Shortness of breath: Secondary | ICD-10-CM | POA: Diagnosis not present

## 2020-05-29 DIAGNOSIS — E7800 Pure hypercholesterolemia, unspecified: Secondary | ICD-10-CM | POA: Diagnosis not present

## 2020-05-29 DIAGNOSIS — I10 Essential (primary) hypertension: Secondary | ICD-10-CM | POA: Diagnosis not present

## 2020-05-29 DIAGNOSIS — Z8249 Family history of ischemic heart disease and other diseases of the circulatory system: Secondary | ICD-10-CM | POA: Diagnosis not present

## 2020-06-09 ENCOUNTER — Ambulatory Visit (HOSPITAL_BASED_OUTPATIENT_CLINIC_OR_DEPARTMENT_OTHER)
Admission: RE | Admit: 2020-06-09 | Discharge: 2020-06-09 | Disposition: A | Discharge status: Home / Self Care | Payer: Medicare Other | Source: Ambulatory Visit | Attending: Nurse Practitioner | Admitting: Nurse Practitioner

## 2020-06-09 ENCOUNTER — Encounter (HOSPITAL_BASED_OUTPATIENT_CLINIC_OR_DEPARTMENT_OTHER): Payer: Self-pay

## 2020-06-09 ENCOUNTER — Other Ambulatory Visit: Payer: Self-pay

## 2020-06-09 DIAGNOSIS — Z1231 Encounter for screening mammogram for malignant neoplasm of breast: Secondary | ICD-10-CM | POA: Insufficient documentation

## 2020-06-09 DIAGNOSIS — Z13820 Encounter for screening for osteoporosis: Secondary | ICD-10-CM | POA: Diagnosis not present

## 2020-06-09 DIAGNOSIS — Z780 Asymptomatic menopausal state: Secondary | ICD-10-CM | POA: Diagnosis not present

## 2020-06-10 ENCOUNTER — Ambulatory Visit (INDEPENDENT_AMBULATORY_CARE_PROVIDER_SITE_OTHER): Payer: Medicare Other

## 2020-06-10 DIAGNOSIS — Z23 Encounter for immunization: Secondary | ICD-10-CM | POA: Diagnosis not present

## 2020-06-10 NOTE — Progress Notes (Signed)
Per orders of Wilfred Lacy, NP, injection of Influenza given by Armandina Gemma, cma.  Patient tolerated injection well.

## 2020-06-20 DIAGNOSIS — I10 Essential (primary) hypertension: Secondary | ICD-10-CM | POA: Diagnosis not present

## 2020-06-25 DIAGNOSIS — Z23 Encounter for immunization: Secondary | ICD-10-CM | POA: Diagnosis not present

## 2020-07-14 ENCOUNTER — Ambulatory Visit (AMBULATORY_SURGERY_CENTER): Payer: Self-pay | Admitting: *Deleted

## 2020-07-14 ENCOUNTER — Other Ambulatory Visit: Payer: Self-pay

## 2020-07-14 VITALS — Ht 62.0 in | Wt 173.0 lb

## 2020-07-14 DIAGNOSIS — Z1211 Encounter for screening for malignant neoplasm of colon: Secondary | ICD-10-CM

## 2020-07-14 MED ORDER — NA SULFATE-K SULFATE-MG SULF 17.5-3.13-1.6 GM/177ML PO SOLN: ORAL | 0 refills | Status: DC

## 2020-07-14 NOTE — Progress Notes (Signed)
Patient is here in-person for PV. Patient denies any allergies to eggs or soy. Patient denies any problems with anesthesia/sedation. Patient denies any oxygen use at home. Patient denies taking any diet/weight loss medications or blood thinners. Patient is not being treated for MRSA or C-diff. Patient is aware of our care-partner policy and [id number] safety protocol. EMMI education assigned to the patient for the procedure, sent to [place].   COVID-19 vaccines completed on 06/2020 booster, per patient.

## 2020-07-28 ENCOUNTER — Ambulatory Visit (AMBULATORY_SURGERY_CENTER): Payer: Medicare Other | Admitting: Gastroenterology

## 2020-07-28 ENCOUNTER — Encounter: Payer: Self-pay | Admitting: Gastroenterology

## 2020-07-28 ENCOUNTER — Other Ambulatory Visit: Payer: Self-pay

## 2020-07-28 VITALS — BP 114/88 | HR 63 | Temp 96.8°F | Resp 10 | Ht 62.0 in | Wt 173.0 lb

## 2020-07-28 DIAGNOSIS — D128 Benign neoplasm of rectum: Secondary | ICD-10-CM

## 2020-07-28 DIAGNOSIS — D127 Benign neoplasm of rectosigmoid junction: Secondary | ICD-10-CM | POA: Diagnosis not present

## 2020-07-28 DIAGNOSIS — Z1211 Encounter for screening for malignant neoplasm of colon: Secondary | ICD-10-CM | POA: Diagnosis not present

## 2020-07-28 DIAGNOSIS — D125 Benign neoplasm of sigmoid colon: Secondary | ICD-10-CM | POA: Diagnosis not present

## 2020-07-28 DIAGNOSIS — D124 Benign neoplasm of descending colon: Secondary | ICD-10-CM

## 2020-07-28 DIAGNOSIS — K635 Polyp of colon: Secondary | ICD-10-CM | POA: Diagnosis not present

## 2020-07-28 MED ORDER — SODIUM CHLORIDE 0.9 % IV SOLN: 500.0000 mL | Freq: Once | INTRAVENOUS | Status: DC

## 2020-07-28 NOTE — Progress Notes (Signed)
PT taken to PACU. Monitors in place. VSS. Report given to RN. 

## 2020-07-28 NOTE — Progress Notes (Signed)
Called to room to assist during endoscopic procedure.  Patient ID and intended procedure confirmed with present staff. Received instructions for my participation in the procedure from the performing physician.  

## 2020-07-28 NOTE — Discharge Instructions (Signed)
Resume previous medications.  AVOID aspirin, ibuprofen, naproxen and other NSAIDS for 5 days (08/02/2020) Handouts on findings given to patient. Await pathology for final recommendations.  YOU HAD AN ENDOSCOPIC PROCEDURE TODAY AT [facility]:   Refer to the procedure report that was given to you for any specific questions about what was found during the examination.  If the procedure report does not answer your questions, please call your gastroenterologist to clarify.  If you requested that your care partner not be given the details of your procedure findings, then the procedure report has been included in a sealed envelope for you to review at your convenience later.  YOU SHOULD EXPECT: Some feelings of bloating in the abdomen. Passage of more gas than usual.  Walking can help get rid of the air that was put into your GI tract during the procedure and reduce the bloating. If you had a lower endoscopy (such as a colonoscopy or flexible sigmoidoscopy) you may notice spotting of blood in your stool or on the toilet paper. If you underwent a bowel prep for your procedure, you may not have a normal bowel movement for a few days.  Please Note:  You might notice some irritation and congestion in your nose or some drainage.  This is from the oxygen used during your procedure.  There is no need for concern and it should clear up in a day or so.  SYMPTOMS TO REPORT IMMEDIATELY:   Following lower endoscopy (colonoscopy or flexible sigmoidoscopy):  Excessive amounts of blood in the stool  Significant tenderness or worsening of abdominal pains  Swelling of the abdomen that is new, acute  Fever of 100F or higher  For urgent or emergent issues, a gastroenterologist can be reached at any hour by calling [phone]. Do not use MyChart messaging for urgent concerns.    DIET:  We do recommend a small meal at first, but then you may proceed to your regular diet.  Drink plenty of fluids  but you should avoid alcoholic beverages for 24 hours.  ACTIVITY:  You should plan to take it easy for the rest of today and you should NOT DRIVE or use heavy machinery until tomorrow (because of the sedation medicines used during the test).    FOLLOW UP: Our staff will call the number listed on your records 48-72 hours following your procedure to check on you and address any questions or concerns that you may have regarding the information given to you following your procedure. If we do not reach you, we will leave a message.  We will attempt to reach you two times.  During this call, we will ask if you have developed any symptoms of COVID 19. If you develop any symptoms (ie: fever, flu-like symptoms, shortness of breath, cough etc.) before then, please call [phone].  If you test positive for Covid 19 in the 2 weeks post procedure, please call and report this information to [place].    If any biopsies were taken you will be contacted by phone or by letter within the next 1-3 weeks.  Please call us at [phone] if you have not heard about the biopsies in 3 weeks.    SIGNATURES/CONFIDENTIALITY: You and/or your care partner have signed paperwork which will be entered into your electronic medical record.  These signatures attest to the fact that that the information above on your After Visit Summary has been reviewed and is understood.  Full responsibility of the confidentiality of this  discharge information lies with you and/or your care-partner. 

## 2020-07-28 NOTE — Progress Notes (Signed)
Pt's states no medical or surgical changes since previsit or office visit. 

## 2020-07-28 NOTE — Op Note (Signed)
[facility] Patient Name: Heather Winters Procedure Date: 07/28/2020 9:59 AM MRN: [id number] Endoscopist: Jackquline Denmark , MD Age: 74 Referring MD:  Date of Birth: 21-Aug-1946 Gender: Female Account #: [id number] Procedure:                Colonoscopy Indications:              Screening for colorectal malignant neoplasm Medicines:                Monitored Anesthesia Care Procedure:                Pre-Anesthesia Assessment:                           - Prior to the procedure, a History and Physical                            was performed, and patient medications and                            allergies were reviewed. The patient's tolerance of                            previous anesthesia was also reviewed. The risks                            and benefits of the procedure and the sedation                            options and risks were discussed with the patient.                            All questions were answered, and informed consent                            was obtained. Prior Anticoagulants: The patient has                            taken no previous anticoagulant or antiplatelet                            agents. ASA Grade Assessment: II - A patient with                            mild systemic disease. After reviewing the risks                            and benefits, the patient was deemed in                            satisfactory condition to undergo the procedure.                           After obtaining informed consent, the colonoscope  was passed under direct vision. Throughout the                            procedure, the patient's blood pressure, pulse, and                            oxygen saturations were monitored continuously. The                            PCF H190 Colonoscope was introduced through the                            anus and advanced to the 2 cm into the ileum. The                            colonoscopy was  performed without difficulty. The                            colon was highly redundant. Passage of scope was                            assisted by abdominal pressure. The patient                            tolerated the procedure well. The quality of the                            bowel preparation was good. The terminal ileum,                            ileocecal valve, appendiceal orifice, and rectum                            were photographed. Scope In: 10:13:54 AM Scope Out: 10:41:26 AM Scope Withdrawal Time: 0 hours 18 minutes 27 seconds  Total Procedure Duration: 0 hours 27 minutes 32 seconds  Findings:                 A 6 mm polyp was found in the mid descending colon.                            The polyp was sessile. The polyp was removed with a                            cold snare. Resection and retrieval were complete.                           A 10 mm polyp was found in the proximal sigmoid                            colon. The polyp was sessile. The polyp was removed  with a hot snare. Resection and retrieval were                            complete.                           A few sessile polyps were found in the mid sigmoid                            colon and distal sigmoid colon. The polyps were 2                            to 4 mm in size. Likely hyperplastic examined by                            NBI. 3 polyps were removed with a cold snare.                            Resection and retrieval were complete.                           A 8 mm polyp was found in the recto-sigmoid colon.                            The polyp was semi-pedunculated. The polyp was                            removed with a hot snare. Resection and retrieval                            were complete.                           A few small-mouthed diverticula were found in the                            sigmoid colon.                           The terminal ileum appeared  normal.                           The exam was otherwise without abnormality on                            direct and retroflexion views. The colon was highly                            redundant. Complications:            No immediate complications. Estimated Blood Loss:     Estimated blood loss: none. Impression:               -Colonic polyps s/p polypectomy.                           -  Mild sigmoid diverticulosis.                           -Highly redundant colon.                           -Otherwise normal colonoscopy to TI. Recommendation:           - Patient has a contact number available for                            emergencies. The signs and symptoms of potential                            delayed complications were discussed with the                            patient. Return to normal activities tomorrow.                            Written discharge instructions were provided to the                            patient.                           - Resume previous diet.                           - Continue present medications.                           - No aspirin, ibuprofen, naproxen, or other                            non-steroidal anti-inflammatory drugs for 5 days                            after polyp removal.                           - Await pathology results.                           - Repeat colonoscopy for surveillance based on                            pathology results.                           - The findings and recommendations were discussed                            with the patient's family. Jackquline Denmark, MD 07/28/2020 10:48:14 AM This report has been signed electronically.

## 2020-07-30 ENCOUNTER — Telehealth: Payer: Self-pay | Admitting: *Deleted

## 2020-07-30 NOTE — Telephone Encounter (Signed)
  Follow up Call-  Call back number 07/28/2020  Post procedure Call Back phone  # [phone]  Permission to leave phone message Yes  Some recent data might be hidden     Patient questions:  Do you have a fever, pain , or abdominal swelling? No. Pain Score  0 *  Have you tolerated food without any problems? Yes.    Have you been able to return to your normal activities? Yes.    Do you have any questions about your discharge instructions: Diet   No. Medications  No. Follow up visit  No.  Do you have questions or concerns about your Care? No.  Actions: * If pain score is 4 or above: No action needed, pain <4.  1. Have you developed a fever since your procedure? no  2.   Have you had an respiratory symptoms (SOB or cough) since your procedure? no  3.   Have you tested positive for COVID 19 since your procedure no  4.   Have you had any family members/close contacts diagnosed with the COVID 19 since your procedure?  no   If yes to any of these questions please route to Joylene John, RN and Joella Prince, RN

## 2020-07-30 NOTE — Telephone Encounter (Signed)
  Follow up Call-  Call back number 07/28/2020  Post procedure Call Back phone  # [phone]  Permission to leave phone message Yes  Some recent data might be hidden     First attempt for follow up phone call. No answer at number given.  Left message on voicemail.

## 2020-08-06 ENCOUNTER — Encounter: Payer: Self-pay | Admitting: Gastroenterology

## 2020-08-18 DIAGNOSIS — N200 Calculus of kidney: Secondary | ICD-10-CM | POA: Diagnosis not present

## 2020-08-27 ENCOUNTER — Telehealth: Payer: Self-pay | Admitting: Nurse Practitioner

## 2020-08-27 ENCOUNTER — Telehealth: Payer: Self-pay

## 2020-08-27 DIAGNOSIS — G8929 Other chronic pain: Secondary | ICD-10-CM

## 2020-08-27 MED ORDER — NAPROXEN 500 MG PO TABS: 500.0000 mg | ORAL_TABLET | Freq: Two times a day (BID) | ORAL | 1 refills | Status: DC | PRN

## 2020-08-27 NOTE — Telephone Encounter (Signed)
Last OV 05/20/20 W/Martha Last fill 02/18/20  #60/1

## 2020-08-27 NOTE — Telephone Encounter (Signed)
MEDICATION(S): naproxen (NAPROSYN) 500 MG tablet 5 pills left, takes Q12H  Has the patient contacted their pharmacy? Yes.   IF NO, request that the patient contact the pharmacy for the refills in the future & the pharmacy will send an electronic request (except for controlled medications). IF YES, when and what did the pharmacy advise? No refills remaining  Preferred Pharmacy:  Karin Golden at [facility] [address], [place] - [address] Phone:  [phone]  Fax:  [phone]

## 2020-08-27 NOTE — Telephone Encounter (Signed)
Sent to provider 

## 2020-08-27 NOTE — Telephone Encounter (Signed)
Refill sent today.

## 2020-08-27 NOTE — Telephone Encounter (Signed)
Ok to refill 

## 2020-09-06 DIAGNOSIS — Z1152 Encounter for screening for COVID-19: Secondary | ICD-10-CM | POA: Diagnosis not present

## 2021-03-11 DIAGNOSIS — Z20822 Contact with and (suspected) exposure to covid-19: Secondary | ICD-10-CM | POA: Diagnosis not present

## 2021-03-13 ENCOUNTER — Ambulatory Visit (INDEPENDENT_AMBULATORY_CARE_PROVIDER_SITE_OTHER): Payer: Medicare Other | Admitting: Nurse Practitioner

## 2021-03-13 ENCOUNTER — Encounter: Payer: Self-pay | Admitting: Nurse Practitioner

## 2021-03-13 ENCOUNTER — Other Ambulatory Visit: Payer: Self-pay

## 2021-03-13 VITALS — BP 142/78 | HR 56 | Temp 97.4°F | Ht 62.0 in | Wt 178.4 lb

## 2021-03-13 DIAGNOSIS — Z13220 Encounter for screening for lipoid disorders: Secondary | ICD-10-CM

## 2021-03-13 DIAGNOSIS — R42 Dizziness and giddiness: Secondary | ICD-10-CM | POA: Diagnosis not present

## 2021-03-13 DIAGNOSIS — Z23 Encounter for immunization: Secondary | ICD-10-CM | POA: Diagnosis not present

## 2021-03-13 DIAGNOSIS — G8929 Other chronic pain: Secondary | ICD-10-CM

## 2021-03-13 DIAGNOSIS — Z1159 Encounter for screening for other viral diseases: Secondary | ICD-10-CM

## 2021-03-13 DIAGNOSIS — M545 Low back pain, unspecified: Secondary | ICD-10-CM | POA: Diagnosis not present

## 2021-03-13 DIAGNOSIS — Z136 Encounter for screening for cardiovascular disorders: Secondary | ICD-10-CM

## 2021-03-13 LAB — CBC WITH DIFFERENTIAL/PLATELET
Basophils Absolute: 0 10*3/uL (ref 0.0–0.1)
Basophils Relative: 0.6 % (ref 0.0–3.0)
Eosinophils Absolute: 0.1 10*3/uL (ref 0.0–0.7)
Eosinophils Relative: 1.3 % (ref 0.0–5.0)
HCT: 42.2 % (ref 36.0–46.0)
Hemoglobin: 13.4 g/dL (ref 12.0–15.0)
Lymphocytes Relative: 34.6 % (ref 12.0–46.0)
Lymphs Abs: 2.1 10*3/uL (ref 0.7–4.0)
MCHC: 31.6 g/dL (ref 30.0–36.0)
MCV: 86.1 fl (ref 78.0–100.0)
Monocytes Absolute: 0.5 10*3/uL (ref 0.1–1.0)
Monocytes Relative: 7.8 % (ref 3.0–12.0)
Neutro Abs: 3.3 10*3/uL (ref 1.4–7.7)
Neutrophils Relative %: 55.7 % (ref 43.0–77.0)
Platelets: 177 10*3/uL (ref 150.0–400.0)
RBC: 4.9 Mil/uL (ref 3.87–5.11)
RDW: 13.7 % (ref 11.5–15.5)
WBC: 5.9 10*3/uL (ref 4.0–10.5)

## 2021-03-13 LAB — COMPREHENSIVE METABOLIC PANEL
ALT: 19 U/L (ref 0–35)
AST: 20 U/L (ref 0–37)
Albumin: 4.2 g/dL (ref 3.5–5.2)
Alkaline Phosphatase: 75 U/L (ref 39–117)
BUN: 12 mg/dL (ref 6–23)
CO2: 25 mEq/L (ref 19–32)
Calcium: 9 mg/dL (ref 8.4–10.5)
Chloride: 105 mEq/L (ref 96–112)
Creatinine, Ser: 0.73 mg/dL (ref 0.40–1.20)
GFR: 80.25 mL/min (ref >60.00)
Glucose, Bld: 106 mg/dL — ABNORMAL HIGH (ref 70–99)
Potassium: 3.8 mEq/L (ref 3.5–5.1)
Sodium: 139 mEq/L (ref 135–145)
Total Bilirubin: 0.7 mg/dL (ref 0.2–1.2)
Total Protein: 6.6 g/dL (ref 6.0–8.3)

## 2021-03-13 LAB — LIPID PANEL
Cholesterol: 108 mg/dL (ref 0–200)
HDL: 43.5 mg/dL (ref >39.00)
LDL Cholesterol: 54 mg/dL (ref 0–99)
NonHDL: 64.02
Total CHOL/HDL Ratio: 2
Triglycerides: 49 mg/dL (ref 0.0–149.0)
VLDL: 9.8 mg/dL (ref 0.0–40.0)

## 2021-03-13 NOTE — Patient Instructions (Signed)
Go to lab for blood draw Start back exercises Use tylenol or naproxen for pain if needed.  Back Exercises The following exercises strengthen the muscles that help to support the trunk and back. They also help to keep the lower back flexible. Doing these exercises can help to prevent back pain or lessen existing pain. If you have back pain or discomfort, try doing these exercises 2-3 times each day or as told by your health care provider. As your pain improves, do them once each day, but increase the number of times that you repeat the steps for each exercise (do more repetitions). To prevent the recurrence of back pain, continue to do these exercises once each day or as told by your health care provider. Do exercises exactly as told by your health care provider and adjust them as directed. It is normal to feel mild stretching, pulling, tightness, or discomfort as you do these exercises, but you should stop right away if youfeel sudden pain or your pain gets worse. Exercises Single knee to chest Repeat these steps 3-5 times for each leg: Lie on your back on a firm bed or the floor with your legs extended. Bring one knee to your chest. Your other leg should stay extended and in contact with the floor. Hold your knee in place by grabbing your knee or thigh with both hands and hold. Pull on your knee until you feel a gentle stretch in your lower back or buttocks. Hold the stretch for 10-30 seconds. Slowly release and straighten your leg. Pelvic tilt Repeat these steps 5-10 times: Lie on your back on a firm bed or the floor with your legs extended. Bend your knees so they are pointing toward the ceiling and your feet are flat on the floor. Tighten your lower abdominal muscles to press your lower back against the floor. This motion will tilt your pelvis so your tailbone points up toward the ceiling instead of pointing to your feet or the floor. With gentle tension and even breathing, hold this  position for 5-10 seconds. Cat-cow Repeat these steps until your lower back becomes more flexible: Get into a hands-and-knees position on a firm surface. Keep your hands under your shoulders, and keep your knees under your hips. You may place padding under your knees for comfort. Let your head hang down toward your chest. Contract your abdominal muscles and point your tailbone toward the floor so your lower back becomes rounded like the back of a cat. Hold this position for 5 seconds. Slowly lift your head, let your abdominal muscles relax and point your tailbone up toward the ceiling so your back forms a sagging arch like the back of a cow. Hold this position for 5 seconds.  Press-ups Repeat these steps 5-10 times: Lie on your abdomen (face-down) on the floor. Place your palms near your head, about shoulder-width apart. Keeping your back as relaxed as possible and keeping your hips on the floor, slowly straighten your arms to raise the top half of your body and lift your shoulders. Do not use your back muscles to raise your upper torso. You may adjust the placement of your hands to make yourself more comfortable. Hold this position for 5 seconds while you keep your back relaxed. Slowly return to lying flat on the floor.  Bridges Repeat these steps 10 times: Lie on your back on a firm surface. Bend your knees so they are pointing toward the ceiling and your feet are flat on the floor. Your  arms should be flat at your sides, next to your body. Tighten your buttocks muscles and lift your buttocks off the floor until your waist is at almost the same height as your knees. You should feel the muscles working in your buttocks and the back of your thighs. If you do not feel these muscles, slide your feet 1-2 inches farther away from your buttocks. Hold this position for 3-5 seconds. Slowly lower your hips to the starting position, and allow your buttocks muscles to relax completely. If this exercise  is too easy, try doing it with your arms crossed over yourchest. Abdominal crunches Repeat these steps 5-10 times: Lie on your back on a firm bed or the floor with your legs extended. Bend your knees so they are pointing toward the ceiling and your feet are flat on the floor. Cross your arms over your chest. Tip your chin slightly toward your chest without bending your neck. Tighten your abdominal muscles and slowly raise your trunk (torso) high enough to lift your shoulder blades a tiny bit off the floor. Avoid raising your torso higher than that because it can put too much stress on your low back and does not help to strengthen your abdominal muscles. Slowly return to your starting position. Back lifts Repeat these steps 5-10 times: Lie on your abdomen (face-down) with your arms at your sides, and rest your forehead on the floor. Tighten the muscles in your legs and your buttocks. Slowly lift your chest off the floor while you keep your hips pressed to the floor. Keep the back of your head in line with the curve in your back. Your eyes should be looking at the floor. Hold this position for 3-5 seconds. Slowly return to your starting position. Contact a health care provider if: Your back pain or discomfort gets much worse when you do an exercise. Your worsening back pain or discomfort does not lessen within 2 hours after you exercise. If you have any of these problems, stop doing these exercises right away. Do not do them again unless your health care provider says that you can. Get help right away if: You develop sudden, severe back pain. If this happens, stop doing the exercises right away. Do not do them again unless your health care provider says that you can. This information is not intended to replace advice given to you by your health care provider. Make sure you discuss any questions you have with your healthcare provider. Document Revised: 12/14/2018 Document Reviewed:  05/11/2018 Elsevier Patient Education  [place].

## 2021-03-13 NOTE — Progress Notes (Signed)
Subjective:  Patient ID: Heather Winters, female    DOB: 02-04-1946  Age: 75 y.o. MRN: [id number]  CC: Pain (C/O pains at mid back, sides and legs x 1 year or more no improvement. Also c/o dizzy spells that come and go. Would like to be tested for diabetes. )   Back Pain This is a chronic problem. The current episode started more than 1 year ago. The problem occurs constantly. The problem is unchanged. The pain is present in the lumbar spine. The quality of the pain is described as aching. The pain does not radiate. The pain is moderate. The pain is Worse during the night. The symptoms are aggravated by bending and lying down. Pertinent negatives include no abdominal pain, bladder incontinence, bowel incontinence, chest pain, dysuria, fever, headaches, leg pain, numbness, paresis, paresthesias, pelvic pain, perianal numbness, tingling, weakness or weight loss. Risk factors include lack of exercise, poor posture, obesity and sedentary lifestyle. She has tried nothing for the symptoms.  Dizziness This is a chronic problem. The current episode started more than 1 year ago. The problem occurs intermittently. The problem has been waxing and waning. Pertinent negatives include no abdominal pain, chest pain, chills, diaphoresis, fatigue, fever, headaches, myalgias, nausea, neck pain, numbness, vertigo, visual change or weakness. Exacerbated by: head movement. She has tried nothing for the symptoms. Improvement on treatment: resolves spontaneously.   Reviewed past Medical, Social and Family history today.  Outpatient Medications Prior to Visit  Medication Sig Dispense Refill   atorvastatin (LIPITOR) 40 MG tablet Take 1 tablet (40 mg total) by mouth daily at 6 PM. 90 tablet 3   naproxen (NAPROSYN) 500 MG tablet Take 1 tablet (500 mg total) by mouth every 12 (twelve) hours as needed. 60 tablet 1   Facility-Administered Medications Prior to Visit  Medication Dose Route Frequency Provider Last Rate Last  Admin   0.9 %  sodium chloride infusion  500 mL Intravenous Once Jackquline Denmark, MD       ROS See HPI  Objective:  BP (!) 142/78 (BP Location: Right Arm, Patient Position: Sitting, Cuff Size: Normal)   Pulse (!) 56   Temp (!) 97.4 F (36.3 C) (Temporal)   Ht '5\' 2"'$  (1.575 m)   Wt 178 lb 6.4 oz (80.9 kg)   SpO2 98%   BMI 32.63 kg/m   Physical Exam Vitals reviewed.  HENT:     Right Ear: Tympanic membrane, ear canal and external ear normal.     Left Ear: Tympanic membrane, ear canal and external ear normal.  Eyes:     Extraocular Movements: Extraocular movements intact.     Conjunctiva/sclera: Conjunctivae normal.  Cardiovascular:     Rate and Rhythm: Normal rate.     Pulses: Normal pulses.  Pulmonary:     Effort: Pulmonary effort is normal.  Abdominal:     Palpations: Abdomen is soft.     Tenderness: There is no right CVA tenderness or left CVA tenderness.  Musculoskeletal:        General: Normal range of motion.     Cervical back: Normal range of motion and neck supple.     Right lower leg: No edema.     Left lower leg: No edema.  Lymphadenopathy:     Cervical: No cervical adenopathy.  Neurological:     Mental Status: She is alert and oriented to person, place, and time.     Cranial Nerves: No cranial nerve deficit.     Motor: No weakness.  Gait: Gait normal.  Psychiatric:        Mood and Affect: Mood normal.        Behavior: Behavior normal.        Thought Content: Thought content normal.   Assessment & Plan:  This visit occurred during the SARS-CoV-2 public health emergency.  Safety protocols were in place, including screening questions prior to the visit, additional usage of staff PPE, and extensive cleaning of exam room while observing appropriate contact time as indicated for disinfecting solutions.   [name] was seen today for pain.  Diagnoses and all orders for this visit:  Chronic midline low back pain without sciatica  Dizziness -     Comprehensive  metabolic panel -     CBC w/Diff  Encounter for lipid screening for cardiovascular disease -     Lipid panel  Encounter for hepatitis C screening test for low risk patient -     Hepatitis C Antibody   Problem List Items Addressed This Visit   None Visit Diagnoses     Chronic midline low back pain without sciatica    -  Primary   Dizziness       Relevant Orders   Comprehensive metabolic panel (Completed)   CBC w/Diff (Completed)   Encounter for lipid screening for cardiovascular disease       Relevant Orders   Lipid panel (Completed)   Encounter for hepatitis C screening test for low risk patient       Relevant Orders   Hepatitis C Antibody       Follow-up: Return if symptoms worsen or fail to improve.  Wilfred Lacy, NP

## 2021-03-13 NOTE — Assessment & Plan Note (Signed)
Normal exam today  Advised to Start back exercises Use tylenol or naproxen for pain if needed.

## 2021-03-16 LAB — HEPATITIS C ANTIBODY
Hepatitis C Ab: NONREACTIVE
SIGNAL TO CUT-OFF: 0.01 (ref <1.00)

## 2021-06-19 ENCOUNTER — Telehealth: Payer: Self-pay | Admitting: Nurse Practitioner

## 2021-06-19 DIAGNOSIS — E782 Mixed hyperlipidemia: Secondary | ICD-10-CM

## 2021-06-19 NOTE — Telephone Encounter (Signed)
Pt requesting refill, atorvastatin (LIPITOR) 40 MG tablet [[id number]]  she is completely out

## 2021-06-23 ENCOUNTER — Other Ambulatory Visit: Payer: Self-pay | Admitting: Nurse Practitioner

## 2021-06-23 DIAGNOSIS — E782 Mixed hyperlipidemia: Secondary | ICD-10-CM

## 2021-06-23 MED ORDER — ATORVASTATIN CALCIUM 40 MG PO TABS: 40.0000 mg | ORAL_TABLET | Freq: Every day | ORAL | 0 refills | Status: DC

## 2021-06-23 NOTE — Telephone Encounter (Signed)
Patient notified and verbalized understanding. Appointment scheduled. 

## 2021-06-23 NOTE — Telephone Encounter (Signed)
Rx sent in with no refills.  Pt needs to schedule f/u appointment for Cholesterol before receiving another Rx.  LVM for patient to return call.

## 2021-06-23 NOTE — Telephone Encounter (Signed)
What is the name of the medication? atorvastatin (LIPITOR) 40 MG tablet [[id number]]   Have you contacted your pharmacy to request a refill? She is needing a refill and she is completely out.   Which pharmacy would you like this sent to? [facility] [id number] [place], [place] - [address] [place], [place] [address]  Phone:  [phone]  Fax:  [phone]    Patient notified that their request is being sent to the clinical staff for review and that they should receive a call once it is complete. If they do not receive a call within 72 hours they can check with their pharmacy or our office.

## 2021-07-07 DIAGNOSIS — Z23 Encounter for immunization: Secondary | ICD-10-CM | POA: Diagnosis not present

## 2021-07-10 ENCOUNTER — Telehealth: Payer: Self-pay | Admitting: Nurse Practitioner

## 2021-07-10 NOTE — Telephone Encounter (Signed)
Left message for patient to call back and schedule Medicare Annual Wellness Visit (AWV) in office.   If not able to come in office, please offer to do virtually or by telephone.  Left office number and my jabber [phone].  Last AWV:05/20/2020  Please schedule at anytime with Nurse Health Advisor.

## 2021-07-15 DIAGNOSIS — Z23 Encounter for immunization: Secondary | ICD-10-CM | POA: Diagnosis not present

## 2021-07-22 NOTE — Telephone Encounter (Signed)
Duplicate

## 2021-07-28 DIAGNOSIS — Z20822 Contact with and (suspected) exposure to covid-19: Secondary | ICD-10-CM | POA: Diagnosis not present

## 2021-08-07 ENCOUNTER — Telehealth: Payer: Self-pay | Admitting: Nurse Practitioner

## 2021-08-07 DIAGNOSIS — Z1231 Encounter for screening mammogram for malignant neoplasm of breast: Secondary | ICD-10-CM

## 2021-08-07 NOTE — Telephone Encounter (Signed)
Pt is wanting a referral for a mammogram. Please advise pt at [phone].

## 2021-08-10 NOTE — Telephone Encounter (Addendum)
Order placed. Pt notified.  

## 2021-08-13 ENCOUNTER — Encounter: Payer: Self-pay | Admitting: Nurse Practitioner

## 2021-08-13 ENCOUNTER — Other Ambulatory Visit: Payer: Self-pay

## 2021-08-13 ENCOUNTER — Ambulatory Visit (INDEPENDENT_AMBULATORY_CARE_PROVIDER_SITE_OTHER): Payer: Medicare Other | Admitting: Nurse Practitioner

## 2021-08-13 VITALS — BP 130/80 | HR 68 | Temp 97.3°F | Ht 62.25 in | Wt 178.6 lb

## 2021-08-13 DIAGNOSIS — E782 Mixed hyperlipidemia: Secondary | ICD-10-CM

## 2021-08-13 MED ORDER — ATORVASTATIN CALCIUM 40 MG PO TABS: 40.0000 mg | ORAL_TABLET | Freq: Every day | ORAL | 3 refills | Status: DC

## 2021-08-13 NOTE — Patient Instructions (Signed)
Schedule alb appt. You will need to be fasting 8hrs prior to blood draw Ok to drink water.  Stay back exercise Use tylenol 500mg  every 8hrs as needed for pain

## 2021-08-13 NOTE — Assessment & Plan Note (Addendum)
No adverse effects with atorvastatin Tobacco use some days LDL at goal Maintain med dose Repeat lipid panel fasting

## 2021-08-13 NOTE — Progress Notes (Signed)
° °  Subjective:  Patient ID: Heather Winters, female    DOB: November 30, 1945  Age: 75 y.o. MRN: [id number]  CC: Follow-up (F/u to continue medication refills for cholesterol. Pt is not fasting. /Declines pneumococcal vaccine./Tdap will be given at local pharmacy. )  HPI  Mixed hyperlipidemia No adverse effects with atorvastatin LDL at goal Maintain med dose Repeat lipid panel fasting  Reviewed past Medical, Social and Family history today.  Outpatient Medications Prior to Visit  Medication Sig Dispense Refill   APPLE CIDER VINEGAR PO Take by mouth.     BIOTIN PO Take 6,000 mcg by mouth.     naproxen (NAPROSYN) 500 MG tablet Take 1 tablet (500 mg total) by mouth every 12 (twelve) hours as needed. 60 tablet 1   atorvastatin (LIPITOR) 40 MG tablet Take 1 tablet (40 mg total) by mouth daily at 6 PM. 60 tablet 0   Facility-Administered Medications Prior to Visit  Medication Dose Route Frequency Provider Last Rate Last Admin   0.9 %  sodium chloride infusion  500 mL Intravenous Once Jackquline Denmark, MD        ROS See HPI  Objective:  BP 130/80 (BP Location: Left Arm, Patient Position: Sitting, Cuff Size: Large)    Pulse 68    Temp (!) 97.3 F (36.3 C) (Temporal)    Ht 5' 2.25" (1.581 m)    Wt 178 lb 9.6 oz (81 kg)    SpO2 99%    BMI 32.40 kg/m   Physical Exam Cardiovascular:     Rate and Rhythm: Normal rate and regular rhythm.     Pulses: Normal pulses.     Heart sounds: Normal heart sounds.  Pulmonary:     Effort: Pulmonary effort is normal.     Breath sounds: Normal breath sounds.  Neurological:     Mental Status: She is alert and oriented to person, place, and time.  Psychiatric:        Mood and Affect: Mood normal.        Behavior: Behavior normal.        Thought Content: Thought content normal.    Assessment & Plan:  This visit occurred during the SARS-CoV-2 public health emergency.  Safety protocols were in place, including screening questions prior to the visit, additional  usage of staff PPE, and extensive cleaning of exam room while observing appropriate contact time as indicated for disinfecting solutions.   Heather Winters was seen today for follow-up.  Diagnoses and all orders for this visit:  Mixed hyperlipidemia -     Lipid panel; Future -     atorvastatin (LIPITOR) 40 MG tablet; Take 1 tablet (40 mg total) by mouth daily at 6 PM.   Problem List Items Addressed This Visit       Other   Mixed hyperlipidemia - Primary    No adverse effects with atorvastatin LDL at goal Maintain med dose Repeat lipid panel fasting      Relevant Medications   atorvastatin (LIPITOR) 40 MG tablet   Other Relevant Orders   Lipid panel    Follow-up: Return in about 6 months (around 02/11/2022), or AWV with wellness coach, for hyperlipidemia (fasting).  Wilfred Lacy, NP

## 2021-08-18 DIAGNOSIS — N202 Calculus of kidney with calculus of ureter: Secondary | ICD-10-CM | POA: Diagnosis not present

## 2021-08-19 ENCOUNTER — Other Ambulatory Visit: Payer: Self-pay

## 2021-08-19 ENCOUNTER — Ambulatory Visit (INDEPENDENT_AMBULATORY_CARE_PROVIDER_SITE_OTHER): Payer: Medicare Other

## 2021-08-19 VITALS — BP 138/64 | HR 70 | Temp 97.6°F | Ht 62.0 in | Wt 176.0 lb

## 2021-08-19 DIAGNOSIS — Z0000 Encounter for general adult medical examination without abnormal findings: Secondary | ICD-10-CM

## 2021-08-19 NOTE — Patient Instructions (Signed)
Heather Winters , Thank you for taking time to come for your Medicare Wellness Visit. I appreciate your ongoing commitment to your health goals. Please review the following plan we discussed and let me know if I can assist you in the future.   Screening recommendations/referrals: Colonoscopy: no longer required  Mammogram: no longer required  Bone Density: 06/09/2020 Recommended yearly ophthalmology/optometry visit for glaucoma screening and checkup Recommended yearly dental visit for hygiene and checkup  Vaccinations: Influenza vaccine: completed  Pneumococcal vaccine: completed  Tdap vaccine: due  Shingles vaccine: completed    Advanced directives: none   Conditions/risks identified: none   Next appointment: none    Preventive Care 76 Years and Older, Female Preventive care refers to lifestyle choices and visits with your health care provider that can promote health and wellness. What does preventive care include? A yearly physical exam. This is also called an annual well check. Dental exams once or twice a year. Routine eye exams. Ask your health care provider how often you should have your eyes checked. Personal lifestyle choices, including: Daily care of your teeth and gums. Regular physical activity. Eating a healthy diet. Avoiding tobacco and drug use. Limiting alcohol use. Practicing safe sex. Taking low-dose aspirin every day. Taking vitamin and mineral supplements as recommended by your health care provider. What happens during an annual well check? The services and screenings done by your health care provider during your annual well check will depend on your age, overall health, lifestyle risk factors, and family history of disease. Counseling  Your health care provider may ask you questions about your: Alcohol use. Tobacco use. Drug use. Emotional well-being. Home and relationship well-being. Sexual activity. Eating habits. History of falls. Memory and ability  to understand (cognition). Work and work Statistician. Reproductive health. Screening  You may have the following tests or measurements: Height, weight, and BMI. Blood pressure. Lipid and cholesterol levels. These may be checked every 5 years, or more frequently if you are over 76 years old. Skin check. Lung cancer screening. You may have this screening every year starting at age 76 if you have a 30-pack-year history of smoking and currently smoke or have quit within the past 15 years. Fecal occult blood test (FOBT) of the stool. You may have this test every year starting at age 76. Flexible sigmoidoscopy or colonoscopy. You may have a sigmoidoscopy every 5 years or a colonoscopy every 10 years starting at age 76. Hepatitis C blood test. Hepatitis B blood test. Sexually transmitted disease (STD) testing. Diabetes screening. This is done by checking your blood sugar (glucose) after you have not eaten for a while (fasting). You may have this done every 1-3 years. Bone density scan. This is done to screen for osteoporosis. You may have this done starting at age 76. Mammogram. This may be done every 1-2 years. Talk to your health care provider about how often you should have regular mammograms. Talk with your health care provider about your test results, treatment options, and if necessary, the need for more tests. Vaccines  Your health care provider may recommend certain vaccines, such as: Influenza vaccine. This is recommended every year. Tetanus, diphtheria, and acellular pertussis (Tdap, Td) vaccine. You may need a Td booster every 10 years. Zoster vaccine. You may need this after age 76. Pneumococcal 13-valent conjugate (PCV13) vaccine. One dose is recommended after age 76. Pneumococcal polysaccharide (PPSV23) vaccine. One dose is recommended after age 76. Talk to your health care provider about which screenings and vaccines  you need and how often you need them. This information is not  intended to replace advice given to you by your health care provider. Make sure you discuss any questions you have with your health care provider. Document Released: 09/05/2015 Document Revised: 04/28/2016 Document Reviewed: 06/10/2015 Elsevier Interactive Patient Education  2017 [employer] Prevention in the Home Falls can cause injuries. They can happen to people of all ages. There are many things you can do to make your home safe and to help prevent falls. What can I do on the outside of my home? Regularly fix the edges of walkways and driveways and fix any cracks. Remove anything that might make you trip as you walk through a door, such as a raised step or threshold. Trim any bushes or trees on the path to your home. Use bright outdoor lighting. Clear any walking paths of anything that might make someone trip, such as rocks or tools. Regularly check to see if handrails are loose or broken. Make sure that both sides of any steps have handrails. Any raised decks and porches should have guardrails on the edges. Have any leaves, snow, or ice cleared regularly. Use sand or salt on walking paths during winter. Clean up any spills in your garage right away. This includes oil or grease spills. What can I do in the bathroom? Use night lights. Install grab bars by the toilet and in the tub and shower. Do not use towel bars as grab bars. Use non-skid mats or decals in the tub or shower. If you need to sit down in the shower, use a plastic, non-slip stool. Keep the floor dry. Clean up any water that spills on the floor as soon as it happens. Remove soap buildup in the tub or shower regularly. Attach bath mats securely with double-sided non-slip rug tape. Do not have throw rugs and other things on the floor that can make you trip. What can I do in the bedroom? Use night lights. Make sure that you have a light by your bed that is easy to reach. Do not use any sheets or blankets that are  too big for your bed. They should not hang down onto the floor. Have a firm chair that has side arms. You can use this for support while you get dressed. Do not have throw rugs and other things on the floor that can make you trip. What can I do in the kitchen? Clean up any spills right away. Avoid walking on wet floors. Keep items that you use a lot in easy-to-reach places. If you need to reach something above you, use a strong step stool that has a grab bar. Keep electrical cords out of the way. Do not use floor polish or wax that makes floors slippery. If you must use wax, use non-skid floor wax. Do not have throw rugs and other things on the floor that can make you trip. What can I do with my stairs? Do not leave any items on the stairs. Make sure that there are handrails on both sides of the stairs and use them. Fix handrails that are broken or loose. Make sure that handrails are as long as the stairways. Check any carpeting to make sure that it is firmly attached to the stairs. Fix any carpet that is loose or worn. Avoid having throw rugs at the top or bottom of the stairs. If you do have throw rugs, attach them to the floor with carpet tape. Make sure  that you have a light switch at the top of the stairs and the bottom of the stairs. If you do not have them, ask someone to add them for you. What else can I do to help prevent falls? Wear shoes that: Do not have high heels. Have rubber bottoms. Are comfortable and fit you well. Are closed at the toe. Do not wear sandals. If you use a stepladder: Make sure that it is fully opened. Do not climb a closed stepladder. Make sure that both sides of the stepladder are locked into place. Ask someone to hold it for you, if possible. Clearly mark and make sure that you can see: Any grab bars or handrails. First and last steps. Where the edge of each step is. Use tools that help you move around (mobility aids) if they are needed. These  include: Canes. Walkers. Scooters. Crutches. Turn on the lights when you go into a dark area. Replace any light bulbs as soon as they burn out. Set up your furniture so you have a clear path. Avoid moving your furniture around. If any of your floors are uneven, fix them. If there are any pets around you, be aware of where they are. Review your medicines with your doctor. Some medicines can make you feel dizzy. This can increase your chance of falling. Ask your doctor what other things that you can do to help prevent falls. This information is not intended to replace advice given to you by your health care provider. Make sure you discuss any questions you have with your health care provider. Document Released: 06/05/2009 Document Revised: 01/15/2016 Document Reviewed: 09/13/2014 Elsevier Interactive Patient Education  2017 [employer].

## 2021-08-19 NOTE — Progress Notes (Signed)
Subjective:   Heather Winters is a 75 y.o. female who presents for Medicare Annual (Subsequent) preventive examination.  Review of Systems     Cardiac Risk Factors include: advanced age (>79men, >28 women)     Objective:    Today's Vitals   08/19/21 1549  BP: 138/64  Pulse: 70  Temp: 97.6 F (36.4 C)  SpO2: 99%  Weight: 176 lb (79.8 kg)  Height: 5\' 2"  (1.575 m)   Body mass index is 32.19 kg/m.  Advanced Directives 08/19/2021 05/20/2020 05/09/2019 01/02/2019 02/22/2018  Does Patient Have a Medical Advance Directive? No No No No No  Would patient like information on creating a medical advance directive? No - Patient declined No - Patient declined No - Patient declined No - Guardian declined No - Patient declined    Current Medications (verified) Outpatient Encounter Medications as of 08/19/2021  Medication Sig   APPLE CIDER VINEGAR PO Take by mouth.   atorvastatin (LIPITOR) 40 MG tablet Take 1 tablet (40 mg total) by mouth daily at 6 PM.   BIOTIN PO Take 6,000 mcg by mouth.   naproxen (NAPROSYN) 500 MG tablet Take 1 tablet (500 mg total) by mouth every 12 (twelve) hours as needed.   Facility-Administered Encounter Medications as of 08/19/2021  Medication   0.9 %  sodium chloride infusion    Allergies (verified) Patient has no known allergies.   History: Past Medical History:  Diagnosis Date   Arthritis    Cataract    History of fainting spells of unknown cause    Hyperlipidemia    Past Surgical History:  Procedure Laterality Date   COLONOSCOPY  2010   [place] =normal exam per pt   Family History  Problem Relation Age of Onset   Diabetes Mother    Colon polyps Sister    Diabetes Brother    Diabetes Brother    Diabetes Brother    COPD Father    Heart disease Neg Hx    Colon cancer Neg Hx    Esophageal cancer Neg Hx    Rectal cancer Neg Hx    Stomach cancer Neg Hx    Social History   Socioeconomic History   Marital status: Widowed    Spouse name: Not  on file   Number of children: Not on file   Years of education: Not on file   Highest education level: Not on file  Occupational History   Occupation: retired  Tobacco Use   Smoking status: Some Days    Packs/day: 0.25    Years: 35.00    Pack years: 8.75    Types: Cigarettes   Smokeless tobacco: Never   Tobacco comments:    pt states she has all the info  Vaping Use   Vaping Use: Never used  Substance and Sexual Activity   Alcohol use: Not Currently   Drug use: No   Sexual activity: Not Currently  Other Topics Concern   Not on file  Social History Narrative   Not on file   Social Determinants of Health   Financial Resource Strain: Low Risk    Difficulty of Paying Living Expenses: Not hard at all  Food Insecurity: No Food Insecurity   Worried About Charity fundraiser in the Last Year: Never true   Sac in the Last Year: Never true  Transportation Needs: No Transportation Needs   Lack of Transportation (Medical): No   Lack of Transportation (Non-Medical): No  Physical Activity: Inactive  Days of Exercise per Week: 0 days   Minutes of Exercise per Session: 0 min  Stress: No Stress Concern Present   Feeling of Stress : Not at all  Social Connections: Moderately Isolated   Frequency of Communication with Friends and Family: Twice a week   Frequency of Social Gatherings with Friends and Family: Twice a week   Attends Religious Services: 1 to 4 times per year   Active Member of [employer] or Organizations: No   Attends Archivist Meetings: Never   Marital Status: Widowed    Tobacco Counseling Ready to quit: Not Answered Counseling given: Not Answered Tobacco comments: pt states she has all the info   Clinical Intake:  Pre-visit preparation completed: Yes  Pain : No/denies pain     Nutritional Risks: None Diabetes: No  How often do you need to have someone help you when you read instructions, pamphlets, or other written materials from your  doctor or pharmacy?: 1 - Never What is the last grade level you completed in school?: college  Diabetic?no  Interpreter Needed?: No  Information entered by :: L.Daliana Leverett,LPN   Activities of Daily Living In your present state of health, do you have any difficulty performing the following activities: 08/19/2021  Hearing? N  Vision? N  Difficulty concentrating or making decisions? N  Walking or climbing stairs? N  Dressing or bathing? N  Doing errands, shopping? N  Preparing Food and eating ? N  Using the Toilet? N  In the past six months, have you accidently leaked urine? N  Do you have problems with loss of bowel control? N  Managing your Medications? N  Managing your Finances? N  Housekeeping or managing your Housekeeping? N  Some recent data might be hidden    Patient Care Team: Nche, Charlene Brooke, NP as PCP - General (Internal Medicine) End, Harrell Gave, MD as PCP - Cardiology (Cardiology)  Indicate any recent Medical Services you may have received from other than [facility] providers in the past year (date may be approximate).     Assessment:   This is a routine wellness examination for [facility].  Hearing/Vision screen Vision Screening - Comments:: Annual eye exams wears glasses   Dietary issues and exercise activities discussed: Current Exercise Habits: The patient does not participate in regular exercise at present, Exercise limited by: None identified   Goals Addressed             This Visit's Progress    DIET - INCREASE WATER INTAKE   On track      Depression Screen PHQ 2/9 Scores 08/19/2021 08/19/2021 03/13/2021 05/20/2020 05/09/2019 02/22/2018 09/02/2017  PHQ - 2 Score 0 0 0 1 0 1 6    Fall Risk Fall Risk  08/19/2021 08/13/2021 03/13/2021 05/20/2020 05/09/2019  Falls in the past year? 0 0 0 0 0  Number falls in past yr: 0 0 - 0 -  Injury with Fall? 0 0 - 0 -  Risk for fall due to : - No Fall Risks - - -  Follow up Falls evaluation completed Falls evaluation  completed - Falls prevention discussed -    FALL RISK PREVENTION PERTAINING TO THE HOME:  Any stairs in or around the home? No  If so, are there any without handrails? No  Home free of loose throw rugs in walkways, pet beds, electrical cords, etc? Yes  Adequate lighting in your home to reduce risk of falls? Yes   ASSISTIVE DEVICES UTILIZED TO PREVENT FALLS:  Life  alert? No  Use of a cane, walker or w/c? No  Grab bars in the bathroom? No  Shower chair or bench in shower? No  Elevated toilet seat or a handicapped toilet? No   TIMED UP AND GO:  Was the test performed? Yes .  Length of time to ambulate 10 feet: 7 sec.   Gait steady and fast without use of assistive device  Cognitive Function:  Normal cognitive status assessed by direct observation by this Nurse Health Advisor. No abnormalities found.     6CIT Screen 05/20/2020  What Year? 0 points  What month? 0 points  What time? 0 points  Count back from 20 0 points  Months in reverse 2 points  Repeat phrase 2 points  Total Score 4    Immunizations Immunization History  Administered Date(s) Administered   Fluad Quad(high Dose 65+) 05/11/2019, 06/10/2020   Influenza, High Dose Seasonal PF 07/05/2018   Influenza-Unspecified 07/05/2021   Janssen (J&J) SARS-COV-2 Vaccination 11/22/2019   PFIZER(Purple Top)SARS-COV-2 Vaccination 11/30/2019, 06/25/2020, 03/13/2021   Zoster Recombinat (Shingrix) 05/12/2019, 08/06/2019    TDAP status: Due, Education has been provided regarding the importance of this vaccine. Advised may receive this vaccine at local pharmacy or Health Dept. Aware to provide a copy of the vaccination record if obtained from local pharmacy or Health Dept. Verbalized acceptance and understanding.  Flu Vaccine status: Up to date  Pneumococcal vaccine status: Up to date  Covid-19 vaccine status: Completed vaccines  Qualifies for Shingles Vaccine? Yes   Zostavax completed Yes   Shingrix Completed?:  Yes  Screening Tests Health Maintenance  Topic Date Due   TETANUS/TDAP  Never done   Pneumonia Vaccine 22+ Years old (1 - PCV) 08/13/2022 (Originally 06/03/1952)   COLONOSCOPY (Pts 45-84yrs Insurance coverage will need to be confirmed)  07/29/2023   INFLUENZA VACCINE  Completed   DEXA SCAN  Completed   Hepatitis C Screening  Completed   Zoster Vaccines- Shingrix  Completed   HPV VACCINES  Aged Out   COVID-19 Vaccine  Discontinued    Health Maintenance  Health Maintenance Due  Topic Date Due   TETANUS/TDAP  Never done    Colorectal cancer screening: No longer required.   Mammogram status: No longer required due to age.  Bone Density status: Completed 06/09/2020. Results reflect: Bone density results: OSTEOPENIA. Repeat every 5 years.  Lung Cancer Screening: (Low Dose CT Chest recommended if Age 39-80 years, 30 pack-year currently smoking OR have quit w/in 15years.) does not qualify.   Lung Cancer Screening Referral: patient declines   Additional Screening:  Hepatitis C Screening: does qualify; Completed 03/13/2021  Vision Screening: Recommended annual ophthalmology exams for early detection of glaucoma and other disorders of the eye. Is the patient up to date with their annual eye exam?  Yes  Who is the provider or what is the name of the office in which the patient attends annual eye exams? [facility]  If pt is not established with a provider, would they like to be referred to a provider to establish care? No .   Dental Screening: Recommended annual dental exams for proper oral hygiene  Community Resource Referral / Chronic Care Management: CRR required this visit?  No   CCM required this visit?  No      Plan:     I have personally reviewed and noted the following in the patients chart:   Medical and social history Use of alcohol, tobacco or illicit drugs  Current medications and  supplements including opioid prescriptions.  Functional ability and  status Nutritional status Physical activity Advanced directives List of other physicians Hospitalizations, surgeries, and ER visits in previous 12 months Vitals Screenings to include cognitive, depression, and falls Referrals and appointments  In addition, I have reviewed and discussed with patient certain preventive protocols, quality metrics, and best practice recommendations. A written personalized care plan for preventive services as well as general preventive health recommendations were provided to patient.     Randel Pigg, LPN   14/05/3012   Nurse Notes: none

## 2021-08-25 ENCOUNTER — Encounter (HOSPITAL_BASED_OUTPATIENT_CLINIC_OR_DEPARTMENT_OTHER): Payer: Self-pay

## 2021-08-25 ENCOUNTER — Other Ambulatory Visit (HOSPITAL_BASED_OUTPATIENT_CLINIC_OR_DEPARTMENT_OTHER): Payer: Self-pay | Admitting: Pediatrics

## 2021-08-25 ENCOUNTER — Encounter (HOSPITAL_BASED_OUTPATIENT_CLINIC_OR_DEPARTMENT_OTHER): Payer: Medicare Other

## 2021-08-25 ENCOUNTER — Other Ambulatory Visit: Payer: Self-pay

## 2021-08-25 ENCOUNTER — Ambulatory Visit (HOSPITAL_BASED_OUTPATIENT_CLINIC_OR_DEPARTMENT_OTHER)
Admission: RE | Admit: 2021-08-25 | Discharge: 2021-08-25 | Disposition: A | Discharge status: Home / Self Care | Payer: Medicare Other | Source: Ambulatory Visit | Attending: Nurse Practitioner | Admitting: Nurse Practitioner

## 2021-08-25 DIAGNOSIS — Z1231 Encounter for screening mammogram for malignant neoplasm of breast: Secondary | ICD-10-CM

## 2021-10-31 DIAGNOSIS — Z20822 Contact with and (suspected) exposure to covid-19: Secondary | ICD-10-CM | POA: Diagnosis not present

## 2021-12-02 DIAGNOSIS — Z20822 Contact with and (suspected) exposure to covid-19: Secondary | ICD-10-CM | POA: Diagnosis not present

## 2021-12-07 DIAGNOSIS — Z20822 Contact with and (suspected) exposure to covid-19: Secondary | ICD-10-CM | POA: Diagnosis not present

## 2021-12-12 DIAGNOSIS — R42 Dizziness and giddiness: Secondary | ICD-10-CM | POA: Diagnosis not present

## 2021-12-14 DIAGNOSIS — R059 Cough, unspecified: Secondary | ICD-10-CM | POA: Diagnosis not present

## 2021-12-14 DIAGNOSIS — Z20822 Contact with and (suspected) exposure to covid-19: Secondary | ICD-10-CM | POA: Diagnosis not present

## 2021-12-14 DIAGNOSIS — R051 Acute cough: Secondary | ICD-10-CM | POA: Diagnosis not present

## 2021-12-16 NOTE — Progress Notes (Signed)
? ?Acute Office Visit ? ?Subjective:  ? ?  ?Patient ID: Heather Winters, female    DOB: 12-14-1945, 76 y.o.   MRN: [id number] ? ?Chief Complaint  ?Patient presents with  ? Dizziness  ?  Pt c/o dizziness and lightheadedness x1 wk   ? ? ?HPI ?Patient is in today for dizziness. She gets dizziness sometimes, but this time it has been constant. She went to urgent care last Saturday and was told everything is normal. Her glucose was slightly elevated. U/A was normal.  ? ?DIZZINESS ? ?Duration: 7 days ?Description of symptoms: lightheaded ?Duration of episode: constant ?Dizziness frequency: recurrent ?Provoking factors: none ?Aggravating factors:  none ?Triggered by rolling over in bed: no ?Triggered by bending over: no ?Aggravated by head movement: no ?Aggravated by exertion, coughing, loud noises: no ?Recent head injury: no ?Recent or current viral symptoms: no ?History of vasovagal episodes: no ?Nausea: no ?Vomiting: no ?Tinnitus: yes - chronic ?Hearing loss: yes - chronic ?Aural fullness: no ?Headache: no ?Photophobia/phonophobia: no ?Unsteady gait: yes ?Postural instability: no ?Diplopia, dysarthria, dysphagia or weakness: no ?Related to exertion: no ?Pallor: no ?Diaphoresis: no ?Dyspnea: no ?Chest pain: no ? ?ROS ?See pertinent positives and negatives per HPI. ?   ?Objective:  ?  ?BP (!) 158/88 (BP Location: Left Arm, Patient Position: Sitting, Cuff Size: Normal)   Pulse 60   Temp (!) 97.1 ?F (36.2 ?C) (Temporal)   Wt 174 lb 9.6 oz (79.2 kg)   SpO2 100%   BMI 31.93 kg/m?  ? ?Orthostatic VS for the past 72 hrs (Last 3 readings): ? Orthostatic BP Patient Position BP Location Cuff Size Orthostatic Pulse  ?12/17/21 1132 140/80 Standing -- -- 64  ?12/17/21 1131 140/78 Sitting -- -- 58  ?12/17/21 1130 150/87 Supine -- -- 62  ?12/17/21 1113 -- Sitting Left Arm Normal --  ?  ? ?Physical Exam ?Vitals and nursing note reviewed.  ?Constitutional:   ?   General: She is not in acute distress. ?   Appearance: Normal appearance.   ?HENT:  ?   Head: Normocephalic.  ?   Right Ear: Tympanic membrane, ear canal and external ear normal.  ?   Left Ear: Tympanic membrane, ear canal and external ear normal.  ?Eyes:  ?   Extraocular Movements: Extraocular movements intact.  ?   Conjunctiva/sclera: Conjunctivae normal.  ?   Pupils: Pupils are equal, round, and reactive to light.  ?Cardiovascular:  ?   Rate and Rhythm: Normal rate and regular rhythm.  ?   Pulses: Normal pulses.  ?   Heart sounds: Normal heart sounds.  ?Pulmonary:  ?   Effort: Pulmonary effort is normal.  ?   Breath sounds: Normal breath sounds.  ?Musculoskeletal:  ?   Cervical back: Normal range of motion. No tenderness.  ?   Comments: Strength 4/5 in bilateral upper and lower extremities   ?Lymphadenopathy:  ?   Cervical: No cervical adenopathy.  ?Skin: ?   General: Skin is warm.  ?Neurological:  ?   General: No focal deficit present.  ?   Mental Status: She is alert and oriented to person, place, and time.  ?   Cranial Nerves: No cranial nerve deficit.  ?   Motor: No weakness.  ?   Coordination: Coordination normal.  ?   Gait: Gait normal.  ?Psychiatric:     ?   Mood and Affect: Mood normal.     ?   Behavior: Behavior normal.     ?  Thought Content: Thought content normal.     ?   Judgment: Judgment normal.  ? ? ?No results found for any visits on 12/17/21. ? ? ?   ?Assessment & Plan:  ? ?Problem List Items Addressed This Visit   ? ?  ? Other  ? Light headedness - Primary  ?  Ongoing now for about a week.  Orthostatic vital signs are negative, Dix-Hallpike maneuver negative.  No red flags on exam, neuro within normal limits.  EKG done today showed sinus bradycardia with a heart rate of 54 and inverted T waves, with a left axis that is similar to prior EKG.  We will check BMP, CBC, TSH.  Encouraged her to drink plenty of fluids over the next few days.  Follow-up in 1 week. ? ?  ?  ? Relevant Orders  ? EKG 12-Lead (Completed)  ? Basic metabolic panel  ? CBC  ? TSH  ? ?Other Visit  Diagnoses   ? ? Fatigue, unspecified type      ? We will check TSH today with persistent lightheadedness.  ? Relevant Orders  ? TSH  ? Elevated glucose      ? We will check A1c today  ? Relevant Orders  ? Hemoglobin A1c  ? ?  ? ? ?No orders of the defined types were placed in this encounter. ? ? ?Return in about 1 week (around 12/24/2021) for light headed with [name] or Ander Purpura. ? ?Charyl Dancer, NP ? ? ?

## 2021-12-17 ENCOUNTER — Ambulatory Visit (INDEPENDENT_AMBULATORY_CARE_PROVIDER_SITE_OTHER): Payer: Medicare Other | Admitting: Nurse Practitioner

## 2021-12-17 ENCOUNTER — Encounter: Payer: Self-pay | Admitting: Nurse Practitioner

## 2021-12-17 VITALS — BP 158/88 | HR 60 | Temp 97.1°F | Wt 174.6 lb

## 2021-12-17 DIAGNOSIS — R5383 Other fatigue: Secondary | ICD-10-CM | POA: Diagnosis not present

## 2021-12-17 DIAGNOSIS — R42 Dizziness and giddiness: Secondary | ICD-10-CM

## 2021-12-17 DIAGNOSIS — R7309 Other abnormal glucose: Secondary | ICD-10-CM | POA: Diagnosis not present

## 2021-12-17 DIAGNOSIS — I951 Orthostatic hypotension: Secondary | ICD-10-CM | POA: Insufficient documentation

## 2021-12-17 LAB — CBC
HCT: 42.2 % (ref 36.0–46.0)
Hemoglobin: 13.1 g/dL (ref 12.0–15.0)
MCHC: 31 g/dL (ref 30.0–36.0)
MCV: 87 fl (ref 78.0–100.0)
Platelets: 170 10*3/uL (ref 150.0–400.0)
RBC: 4.84 Mil/uL (ref 3.87–5.11)
RDW: 14 % (ref 11.5–15.5)
WBC: 6.1 10*3/uL (ref 4.0–10.5)

## 2021-12-17 LAB — BASIC METABOLIC PANEL
BUN: 13 mg/dL (ref 6–23)
CO2: 26 mEq/L (ref 19–32)
Calcium: 9 mg/dL (ref 8.4–10.5)
Chloride: 106 mEq/L (ref 96–112)
Creatinine, Ser: 0.63 mg/dL (ref 0.40–1.20)
GFR: 86.1 mL/min (ref >60.00)
Glucose, Bld: 64 mg/dL — ABNORMAL LOW (ref 70–99)
Potassium: 3.5 mEq/L (ref 3.5–5.1)
Sodium: 141 mEq/L (ref 135–145)

## 2021-12-17 LAB — TSH: TSH: 1.78 u[IU]/mL (ref 0.35–5.50)

## 2021-12-17 NOTE — Patient Instructions (Addendum)
It was great to see you! ? ?Make sure you are drinking plenty of fluids. We are going to more labs. You can try flonase nasal spray to see if this helps with your symptoms.  ? ?Let's follow-up in 1 week, sooner if you have concerns. ? ?If a referral was placed today, you will be contacted for an appointment. Please note that routine referrals can sometimes take up to 3-4 weeks to process. Please call our office if you haven't heard anything after this time frame. ? ?Take care, ? ?Vance Peper, NP ? ?

## 2021-12-17 NOTE — Progress Notes (Signed)
EKG interpreted by me on 12/17/21 showed sinus bradycardia, heart rate 54, inverted T waves similar to prior EKG.  New left axis deviation. ? ?

## 2021-12-17 NOTE — Assessment & Plan Note (Signed)
Ongoing now for about a week.  Orthostatic vital signs are negative, Dix-Hallpike maneuver negative.  No red flags on exam, neuro within normal limits.  EKG done today showed sinus bradycardia with a heart rate of 54 and inverted T waves, with a left axis that is similar to prior EKG.  We will check BMP, CBC, TSH.  Encouraged her to drink plenty of fluids over the next few days.  Follow-up in 1 week. ?

## 2021-12-24 ENCOUNTER — Ambulatory Visit (INDEPENDENT_AMBULATORY_CARE_PROVIDER_SITE_OTHER): Payer: Medicare Other | Admitting: Nurse Practitioner

## 2021-12-24 ENCOUNTER — Encounter: Payer: Self-pay | Admitting: Nurse Practitioner

## 2021-12-24 VITALS — BP 132/82 | HR 72 | Temp 97.3°F | Ht 62.0 in | Wt 175.8 lb

## 2021-12-24 DIAGNOSIS — J0110 Acute frontal sinusitis, unspecified: Secondary | ICD-10-CM | POA: Diagnosis not present

## 2021-12-24 MED ORDER — FLUTICASONE PROPIONATE 50 MCG/ACT NA SUSP: 2.0000 | Freq: Every day | NASAL | 0 refills | Status: DC

## 2021-12-24 MED ORDER — CEFDINIR 300 MG PO CAPS: 300.0000 mg | ORAL_CAPSULE | Freq: Two times a day (BID) | ORAL | 0 refills | Status: DC

## 2021-12-24 MED ORDER — LORATADINE 10 MG PO TABS: 10.0000 mg | ORAL_TABLET | Freq: Every day | ORAL | 0 refills | Status: DC

## 2021-12-24 NOTE — Patient Instructions (Signed)
Avoid skipping meals ?Maintain adequate oral hydration. ?Call office if no improvement in 1week. ?

## 2021-12-24 NOTE — Progress Notes (Signed)
? ?             Established Patient Visit ? ?Patient: Heather Winters   DOB: 1946-04-01   76 y.o. Female  MRN: [id number] ?Visit Date: 12/24/2021 ? ?Subjective:  ?  ?Chief Complaint  ?Patient presents with  ? Dizziness  ?  1 week f/u. ?Has been experiencing dizziness for 3 weeks now with no relief. ?Not taking anything for it currently. ?Interested in [place] vaccine.   ?124/90 ?Dizziness ?This is a recurrent problem. The current episode started 1 to 4 weeks ago. The problem occurs constantly. The problem has been unchanged. Pertinent negatives include no anorexia, chest pain, chills, congestion, coughing, diaphoresis, fatigue, fever, headaches, joint swelling, myalgias, nausea, urinary symptoms, vertigo, visual change or weakness. Associated symptoms comments: Describes as frontal pressure and lightheadedness. The symptoms are aggravated by bending. She has tried nothing for the symptoms.  ?BP Readings from Last 3 Encounters:  ?12/24/21 132/82  ?12/17/21 (!) 158/88  ?08/19/21 138/64  ?Daily tobacco use. ? ?Reviewed medical, surgical, and social history today ? ?Medications: ?Outpatient Medications Prior to Visit  ?Medication Sig  ? APPLE CIDER VINEGAR PO Take by mouth.  ? atorvastatin (LIPITOR) 40 MG tablet Take 1 tablet (40 mg total) by mouth daily at 6 PM.  ? BIOTIN PO Take 6,000 mcg by mouth.  ? naproxen (NAPROSYN) 500 MG tablet Take 1 tablet (500 mg total) by mouth every 12 (twelve) hours as needed.  ? ?Facility-Administered Medications Prior to Visit  ?Medication Dose Route Frequency Provider  ? 0.9 %  sodium chloride infusion  500 mL Intravenous Once Jackquline Denmark, MD  ? ?Reviewed past medical and social history.  ? ?ROS per HPI above ? ? ?   ?Objective:  ?BP 132/82 (BP Location: Right Arm, Patient Position: Sitting, Cuff Size: Small)   Pulse 72   Temp (!) 97.3 ?F (36.3 ?C) (Temporal)   Ht '5\' 2"'$  (1.575 m)   Wt 175 lb 12.8 oz (79.7 kg)   SpO2 97%   BMI 32.15 kg/m?  ? ?  ? ?Physical Exam ?HENT:  ?   Right Ear:  Hearing, tympanic membrane, ear canal and external ear normal.  ?   Left Ear: Hearing, tympanic membrane, ear canal and external ear normal.  ?   Nose: Mucosal edema present. No congestion or rhinorrhea.  ?   Right Turbinates: Not enlarged, swollen or pale.  ?   Left Turbinates: Not enlarged, swollen or pale.  ?   Right Sinus: Frontal sinus tenderness present. No maxillary sinus tenderness.  ?   Left Sinus: Frontal sinus tenderness present. No maxillary sinus tenderness.  ?Eyes:  ?   Extraocular Movements: Extraocular movements intact.  ?   Conjunctiva/sclera: Conjunctivae normal.  ?   Pupils: Pupils are equal, round, and reactive to light.  ?Cardiovascular:  ?   Rate and Rhythm: Normal rate and regular rhythm.  ?   Pulses: Normal pulses.  ?   Heart sounds: Murmur heard.  ?Pulmonary:  ?   Effort: Pulmonary effort is normal.  ?   Breath sounds: Normal breath sounds.  ?Musculoskeletal:  ?   Cervical back: Normal range of motion.  ?   Right lower leg: No edema.  ?   Left lower leg: No edema.  ?Neurological:  ?   Mental Status: She is alert and oriented to person, place, and time.  ?   Comments: Negative epley maneuver.  ?Psychiatric:     ?   Mood and Affect: Mood normal.     ?  Behavior: Behavior normal.     ?   Thought Content: Thought content normal.  ?  ?No results found for any visits on 12/24/21. ?   ?Assessment & Plan:  ?  ?Problem List Items Addressed This Visit   ?None ?Visit Diagnoses   ? ? Acute non-recurrent frontal sinusitis    -  Primary  ? Relevant Medications  ? fluticasone (FLONASE) 50 MCG/ACT nasal spray  ? loratadine (CLARITIN) 10 MG tablet  ? cefdinir (OMNICEF) 300 MG capsule  ? ?  ? ?Return in about 6 months (around 06/26/2022) for hyperlipidemia. ? ?  ? ?Wilfred Lacy, NP ? ? ?

## 2021-12-25 DIAGNOSIS — Z20822 Contact with and (suspected) exposure to covid-19: Secondary | ICD-10-CM | POA: Diagnosis not present

## 2021-12-28 DIAGNOSIS — Z20822 Contact with and (suspected) exposure to covid-19: Secondary | ICD-10-CM | POA: Diagnosis not present

## 2022-02-11 ENCOUNTER — Encounter: Payer: Self-pay | Admitting: Nurse Practitioner

## 2022-02-11 ENCOUNTER — Ambulatory Visit (INDEPENDENT_AMBULATORY_CARE_PROVIDER_SITE_OTHER): Payer: Medicare Other | Admitting: Nurse Practitioner

## 2022-02-11 VITALS — BP 126/86 | HR 53 | Temp 96.9°F | Ht 62.0 in | Wt 177.0 lb

## 2022-02-11 DIAGNOSIS — E1165 Type 2 diabetes mellitus with hyperglycemia: Secondary | ICD-10-CM

## 2022-02-11 DIAGNOSIS — I951 Orthostatic hypotension: Secondary | ICD-10-CM

## 2022-02-11 DIAGNOSIS — E782 Mixed hyperlipidemia: Secondary | ICD-10-CM | POA: Diagnosis not present

## 2022-02-11 DIAGNOSIS — I340 Nonrheumatic mitral (valve) insufficiency: Secondary | ICD-10-CM | POA: Insufficient documentation

## 2022-02-11 LAB — MICROALBUMIN / CREATININE URINE RATIO
Creatinine,U: 33.4 mg/dL
Microalb Creat Ratio: 2.1 mg/g (ref 0.0–30.0)
Microalb, Ur: <0.7 mg/dL (ref 0.0–1.9)

## 2022-02-11 LAB — POCT GLYCOSYLATED HEMOGLOBIN (HGB A1C): Hemoglobin A1C: 6.5 % — AB (ref 4.0–5.6)

## 2022-02-11 NOTE — Progress Notes (Deleted)
fic

## 2022-02-11 NOTE — Patient Instructions (Addendum)
Need to complete a diabetic eye exam once a year. You will be contacted to schedule appt with a nutritionist. With orthostatic hypotension: you need to change positions slowly and maintain adequate oral hydration ( 60oz of water per day)  Go to lab for urine collection (check urine for protein due to DM)  Diabetes Mellitus and Nutrition, Adult When you have diabetes, or diabetes mellitus, it is very important to have healthy eating habits because your blood sugar (glucose) levels are greatly affected by what you eat and drink. Eating healthy foods in the right amounts, at about the same times every day, can help you: Manage your blood glucose. Lower your risk of heart disease. Improve your blood pressure. Reach or maintain a healthy weight. What can affect my meal plan? Every person with diabetes is different, and each person has different needs for a meal plan. Your health care provider may recommend that you work with a dietitian to make a meal plan that is best for you. Your meal plan may vary depending on factors such as: The calories you need. The medicines you take. Your weight. Your blood glucose, blood pressure, and cholesterol levels. Your activity level. Other health conditions you have, such as heart or kidney disease. How do carbohydrates affect me? Carbohydrates, also called carbs, affect your blood glucose level more than any other type of food. Eating carbs raises the amount of glucose in your blood. It is important to know how many carbs you can safely have in each meal. This is different for every person. Your dietitian can help you calculate how many carbs you should have at each meal and for each snack. How does alcohol affect me? Alcohol can cause a decrease in blood glucose (hypoglycemia), especially if you use insulin or take certain diabetes medicines by mouth. Hypoglycemia can be a life-threatening condition. Symptoms of hypoglycemia, such as sleepiness, dizziness, and  confusion, are similar to symptoms of having too much alcohol. Do not drink alcohol if: Your health care provider tells you not to drink. You are pregnant, may be pregnant, or are planning to become pregnant. If you drink alcohol: Limit how much you have to: 0-1 drink a day for women. 0-2 drinks a day for men. Know how much alcohol is in your drink. In the [place] one drink equals one 12 oz bottle of beer (355 mL), one 5 oz glass of wine (148 mL), or one 1 oz glass of hard liquor (44 mL). Keep yourself hydrated with water, diet soda, or unsweetened iced tea. Keep in mind that regular soda, juice, and other mixers may contain a lot of sugar and must be counted as carbs. What are tips for following this plan?  Reading food labels Start by checking the serving size on the Nutrition Facts label of packaged foods and drinks. The number of calories and the amount of carbs, fats, and other nutrients listed on the label are based on one serving of the item. Many items contain more than one serving per package. Check the total grams (g) of carbs in one serving. Check the number of grams of saturated fats and trans fats in one serving. Choose foods that have a low amount or none of these fats. Check the number of milligrams (mg) of salt (sodium) in one serving. Most people should limit total sodium intake to less than 2,300 mg per day. Always check the nutrition information of foods labeled as "low-fat" or "nonfat." These foods may be higher in added sugar  or refined carbs and should be avoided. Talk to your dietitian to identify your daily goals for nutrients listed on the label. Shopping Avoid buying canned, pre-made, or processed foods. These foods tend to be high in fat, sodium, and added sugar. Shop around the outside edge of the grocery store. This is where you will most often find fresh fruits and vegetables, bulk grains, fresh meats, and fresh dairy products. Cooking Use low-heat cooking methods,  such as baking, instead of high-heat cooking methods, such as deep frying. Cook using healthy oils, such as olive, canola, or sunflower oil. Avoid cooking with butter, cream, or high-fat meats. Meal planning Eat meals and snacks regularly, preferably at the same times every day. Avoid going long periods of time without eating. Eat foods that are high in fiber, such as fresh fruits, vegetables, beans, and whole grains. Eat 4-6 oz (112-168 g) of lean protein each day, such as lean meat, chicken, fish, eggs, or tofu. One ounce (oz) (28 g) of lean protein is equal to: 1 oz (28 g) of meat, chicken, or fish. 1 egg.  cup (62 g) of tofu. Eat some foods each day that contain healthy fats, such as avocado, nuts, seeds, and fish. What foods should I eat? Fruits Berries. Apples. Oranges. Peaches. Apricots. Plums. Grapes. Mangoes. Papayas. Pomegranates. Kiwi. Cherries. Vegetables Leafy greens, including lettuce, spinach, kale, chard, collard greens, mustard greens, and cabbage. Beets. Cauliflower. Broccoli. Carrots. Green beans. Tomatoes. Peppers. Onions. Cucumbers. Brussels sprouts. Grains Whole grains, such as whole-wheat or whole-grain bread, crackers, tortillas, cereal, and pasta. Unsweetened oatmeal. Quinoa. Brown or wild rice. Meats and other proteins Seafood. Poultry without skin. Lean cuts of poultry and beef. Tofu. Nuts. Seeds. Dairy Low-fat or fat-free dairy products such as milk, yogurt, and cheese. The items listed above may not be a complete list of foods and beverages you can eat and drink. Contact a dietitian for more information. What foods should I avoid? Fruits Fruits canned with syrup. Vegetables Canned vegetables. Frozen vegetables with butter or cream sauce. Grains Refined white flour and flour products such as bread, pasta, snack foods, and cereals. Avoid all processed foods. Meats and other proteins Fatty cuts of meat. Poultry with skin. Breaded or fried meats. Processed  meat. Avoid saturated fats. Dairy Full-fat yogurt, cheese, or milk. Beverages Sweetened drinks, such as soda or iced tea. The items listed above may not be a complete list of foods and beverages you should avoid. Contact a dietitian for more information. Questions to ask a health care provider Do I need to meet with a certified diabetes care and education specialist? Do I need to meet with a dietitian? What number can I call if I have questions? When are the best times to check my blood glucose? Where to find more information: American Diabetes Association: diabetes.org Academy of Nutrition and Dietetics: eatright.[employer] of Diabetes and Digestive and Kidney Diseases: [employer] Association of Diabetes Care & Education Specialists: diabeteseducator.org Summary It is important to have healthy eating habits because your blood sugar (glucose) levels are greatly affected by what you eat and drink. It is important to use alcohol carefully. A healthy meal plan will help you manage your blood glucose and lower your risk of heart disease. Your health care provider may recommend that you work with a dietitian to make a meal plan that is best for you. This information is not intended to replace advice given to you by your health care provider. Make sure you discuss any questions you  have with your health care provider. Document Revised: 03/12/2020 Document Reviewed: 03/12/2020 Elsevier Patient Education  [place].  Diabetes Mellitus and Exercise Exercising regularly is important for overall health, especially for people who have diabetes mellitus. Exercising is not only about losing weight. It has many other health benefits, such as increasing muscle strength and bone density and reducing body fat and stress. This leads to improved fitness, flexibility, and endurance, all of which result in better overall health. What are the benefits of exercise if I have  diabetes? Exercise has many benefits for people with diabetes. They include: Helping to lower and control blood sugar (glucose). Helping the body to respond better to the hormone insulin by improving insulin sensitivity. Reducing how much insulin the body needs. Lowering the risk for heart disease by: Lowering "bad" cholesterol and triglyceride levels. Increasing "good" cholesterol levels. Lowering blood pressure. Lowering blood glucose levels. What is my activity plan? Your health care provider or certified diabetes educator can help you make a plan for the type and frequency of exercise that works for you. This is called your activity plan. Be sure to: Get at least 150 minutes of medium-intensity or high-intensity exercise each week. Exercises may include brisk walking, biking, or water aerobics. Do stretching and strengthening exercises, such as yoga or weight lifting, at least 2 times a week. Spread out your activity over at least 3 days of the week. Get some form of physical activity each day. Do not go more than 2 days in a row without some kind of physical activity. Avoid being inactive for more than 90 minutes at a time. Take frequent breaks to walk or stretch. Choose exercises or activities that you enjoy. Set realistic goals. Start slowly and gradually increase your exercise intensity over time. How do I manage my diabetes during exercise?  Monitor your blood glucose Check your blood glucose before and after exercising. If your blood glucose is: 240 mg/dL (13.3 mmol/L) or higher before you exercise, check your urine for ketones. These are chemicals created by the liver. If you have ketones in your urine, do not exercise until your blood glucose returns to normal. 100 mg/dL (5.6 mmol/L) or lower, eat a snack containing 15-20 grams of carbohydrate. Check your blood glucose 15 minutes after the snack to make sure that your glucose level is above 100 mg/dL (5.6 mmol/L) before you start  your exercise. Know the symptoms of low blood glucose (hypoglycemia) and how to treat it. Your risk for hypoglycemia increases during and after exercise. Follow these tips and your health care provider's instructions Keep a carbohydrate snack that is fast-acting for use before, during, and after exercise to help prevent or treat hypoglycemia. Avoid injecting insulin into areas of the body that are going to be exercised. For example, avoid injecting insulin into: Your arms, when you are about to play tennis. Your legs, when you are about to go jogging. Keep records of your exercise habits. Doing this can help you and your health care provider adjust your diabetes management plan as needed. Write down: Food that you eat before and after you exercise. Blood glucose levels before and after you exercise. The type and amount of exercise you have done. Work with your health care provider when you start a new exercise or activity. He or she may need to: Make sure that the activity is safe for you. Adjust your insulin, other medicines, and food that you eat. Drink plenty of water while you exercise. This prevents  loss of water (dehydration) and problems caused by a lot of heat in the body (heat stroke). Where to find more information American Diabetes Association: www.diabetes.org Summary Exercising regularly is important for overall health, especially for people who have diabetes mellitus. Exercising has many health benefits. It increases muscle strength and bone density and reduces body fat and stress. It also lowers and controls blood glucose. Your health care provider or certified diabetes educator can help you make an activity plan for the type and frequency of exercise that works for you. Work with your health care provider to make sure any new activity is safe for you. Also work with your health care provider to adjust your insulin, other medicines, and the food you eat. This information is not  intended to replace advice given to you by your health care provider. Make sure you discuss any questions you have with your health care provider. Document Revised: 05/07/2019 Document Reviewed: 05/07/2019 Elsevier Patient Education  [place].

## 2022-02-11 NOTE — Assessment & Plan Note (Addendum)
Reports intermittent lightheadedness-feels unsteady, no vertigo, no syncope, no tinnitus unknown trigger Positive orthostatic vital signs today Chronic sinus bradycardia, normal PR interval. Last Echo 2019: normal EF, normal wall motion, mild mitral valve regurgitation.  Advised to maintain adequate oral hydration, avoid head position below heart, change positions slowly.

## 2022-02-11 NOTE — Assessment & Plan Note (Signed)
Maintain atorvastatin dose

## 2022-02-11 NOTE — Progress Notes (Signed)
Established Patient Visit  Patient: Heather Winters   DOB: 1946/05/09   76 y.o. Female  MRN: [id number] Visit Date: 02/11/2022  Subjective:    Chief Complaint  Patient presents with   Office Visit    6 month f/u Hyperlipidemia No concerns   HPI Type 2 diabetes mellitus with hyperglycemia, without long-term current use of insulin ([place]) New diagnosis. hgbA1c of 6.5% today Fhx of DM-brother and mother Advised about diabetes disease process and possible complications. Advised about need for lifestyle modifications. She agreed to nutritionist referral and to schedule DM eye exam.  F/up in 6month Entered nutrition referral Check urine microalbumin Provided printed information on diabetic diet and exercise  Orthostatic hypotension Reports intermittent lightheadedness-feels unsteady, no vertigo, no syncope, no tinnitus unknown trigger Positive orthostatic vital signs today Chronic sinus bradycardia, normal PR interval. Last Echo 2019: normal EF, normal wall motion, mild mitral valve regurgitation.  Advised to maintain adequate oral hydration, avoid head position below heart, change positions slowly.  Mixed hyperlipidemia Maintain atorvastatin dose  BP Readings from Last 3 Encounters:  02/11/22 126/86  12/24/21 132/82  12/17/21 (!) 158/88    Wt Readings from Last 3 Encounters:  02/11/22 177 lb (80.3 kg)  12/24/21 175 lb 12.8 oz (79.7 kg)  12/17/21 174 lb 9.6 oz (79.2 kg)    Reviewed medical, surgical, and social history today  Medications: Outpatient Medications Prior to Visit  Medication Sig   APPLE CIDER VINEGAR PO Take by mouth.   atorvastatin (LIPITOR) 40 MG tablet Take 1 tablet (40 mg total) by mouth daily at 6 PM.   BIOTIN PO Take 6,000 mcg by mouth.   fluticasone (FLONASE) 50 MCG/ACT nasal spray Place 2 sprays into both nostrils daily.   naproxen (NAPROSYN) 500 MG tablet Take 1 tablet (500 mg total) by mouth every 12 (twelve) hours as needed.    [DISCONTINUED] cefdinir (OMNICEF) 300 MG capsule Take 1 capsule (300 mg total) by mouth 2 (two) times daily. (Patient not taking: Reported on 02/11/2022)   [DISCONTINUED] loratadine (CLARITIN) 10 MG tablet Take 1 tablet (10 mg total) by mouth at bedtime. (Patient not taking: Reported on 02/11/2022)   Facility-Administered Medications Prior to Visit  Medication Dose Route Frequency Provider   0.9 %  sodium chloride infusion  500 mL Intravenous Once GJackquline Denmark MD   Reviewed past medical and social history.   ROS per HPI above      Objective:  BP 126/86 (BP Location: Right Arm, Patient Position: Sitting, Cuff Size: Normal)   Pulse (!) 53   Temp (!) 96.9 F (36.1 C) (Temporal)   Ht '5\' 2"'$  (1.575 m)   Wt 177 lb (80.3 kg)   SpO2 99%   BMI 32.37 kg/m      Physical Exam  Results for orders placed or performed in visit on 02/11/22  POCT glycosylated hemoglobin (Hb A1C)  Result Value Ref Range   Hemoglobin A1C 6.5 (A) 4.0 - 5.6 %      Assessment & Plan:    Problem List Items Addressed This Visit       Cardiovascular and Mediastinum   Orthostatic hypotension    Reports intermittent lightheadedness-feels unsteady, no vertigo, no syncope, no tinnitus unknown trigger Positive orthostatic vital signs today Chronic sinus bradycardia, normal PR interval. Last Echo 2019: normal EF, normal wall motion, mild mitral valve regurgitation.  Advised to maintain adequate oral hydration, avoid head position below heart,  change positions slowly.        Endocrine   Type 2 diabetes mellitus with hyperglycemia, without long-term current use of insulin ([place])    New diagnosis. hgbA1c of 6.5% today Fhx of DM-brother and mother Advised about diabetes disease process and possible complications. Advised about need for lifestyle modifications. She agreed to nutritionist referral and to schedule DM eye exam.  F/up in 69month Entered nutrition referral Check urine microalbumin Provided printed  information on diabetic diet and exercise      Relevant Orders   POCT glycosylated hemoglobin (Hb A1C) (Completed)   Referral to Nutrition and Diabetes Services   Urine microalbumin-creatinine with uACR     Other   Mixed hyperlipidemia - Primary    Maintain atorvastatin dose      Return in about 3 months (around 05/14/2022) for DM and , hyperlipidemia (fasting).     CWilfred Lacy NP

## 2022-02-11 NOTE — Assessment & Plan Note (Addendum)
New diagnosis. hgbA1c of 6.5% today Fhx of DM-brother and mother Advised about diabetes disease process and possible complications. Advised about need for lifestyle modifications. She agreed to nutritionist referral and to schedule DM eye exam.  F/up in 60month Entered nutrition referral Check urine microalbumin Provided printed information on diabetic diet and exercise

## 2022-02-22 ENCOUNTER — Encounter: Payer: Medicare Other | Attending: Nurse Practitioner | Admitting: Nutrition

## 2022-02-22 DIAGNOSIS — E1165 Type 2 diabetes mellitus with hyperglycemia: Secondary | ICD-10-CM | POA: Diagnosis not present

## 2022-02-22 DIAGNOSIS — Z713 Dietary counseling and surveillance: Secondary | ICD-10-CM | POA: Insufficient documentation

## 2022-02-22 NOTE — Progress Notes (Unsigned)
Mrs. Heather Winters is here because she is newly diagnosed.  She reports a strong family history of diabetes, but not much knowledge about it.    Medications:  none for diabetes,  Exercise:  none Diet:  Typical day: 6AM:  up 7-8AM:  Bfast:  cold cereal and milk (raisen bran, [place] with fruit, 2% milk. Or, 1 fried egg with cheese, 2-3 pieces of sausage or bacon, grits with butter.  No toast 10AM- 4PM: grazes on fruit, chips, pork rinds Discussed: 1,  We discussed the idea of insulin resistance and ways to reduce this by exercise, weight loss and diet.  What happens in the body when you get diabetes, and what happens over time if nothing is done to decrease insulin resistance. 2.  The 3 main main food groups and how/when they raise blood sugar, and what foods are in each group, Discussion about the need for all 3 food groups in each meal, but not too much of any one. carbs: 2 servings at breakfast, 2 at lunch, 3 at supper, and 1 at HS snack time.  Protein: 1 ounce breakfast, 2 at lunch and 3 at supper and 1 at HS snack Fat:  1 at each meal 3.  Limit fruit to one serving at a time: 12 grapes, 1/2 banana, 1 small apple (or 1/2 large apple), etc 4.  Reduce amount of fat and high fat proteins at every meal.  Use spray oil, light or whipped butter, light salad dressings, reduced fat cheeses, and elimination of cheese on sandwiches for lower calorie/saturated fat, and fried foods,  5.  Stop all cold cereal and milk, fruit juices and sweetened drinks. 6. Discussed the importance of 20-30 minutes of exercise 4-5 days/wk, and the effects of this on blood sugars and increased insulin sensitivity.   7.  Need to reduce candy/sweets during the day to 1 small snack size piece/ day (70 calories) Handouts given on exchange lists, 1800 calorie diet with serving sizes as above, label reading, 150 calorie snack options with 15 grams of carb and 7 grams of protein, as well as snacks with less than 50 calories.   8 Blood sugar  goals.  Ac: less than 110, and 2hr. Pc: less than 160.  She was given a Dexcom sample with reader, so that she can see the effects of her diet on blood sugar readings after meals.  I am hoping that this will motivate her to stop eating candy during the day, and other sweets.  She is aware that this will last only 10 days.  Lot: [id number]    exp.: 05/23/23

## 2022-02-26 NOTE — Patient Instructions (Signed)
Stop all cold cereal, sweet drinks and fruit juices Have 3 meals and bedtime snack Read over handouts given and call if questions. 4.  Exercise for 20-30 minutes 4X/wk, if ok ed by you MD 5.  Follow meal plan given, making sure to eat 3 meals with protein, carbohydrate and small amount of fat. 6.  Check blood sugar readings before meals and 2hr. After meals.  Remember:  goals before meals: less than 120, and 2hr. After:          Less than 160.

## 2022-03-04 ENCOUNTER — Telehealth: Payer: Self-pay | Admitting: Nurse Practitioner

## 2022-03-04 NOTE — Telephone Encounter (Signed)
Pt had an appt with a dietician for her diabetes.They gave her a patch that was good for 5 days today is the last day. Should she have another or something else?

## 2022-03-05 NOTE — Telephone Encounter (Signed)
Pt advised.

## 2022-05-14 ENCOUNTER — Encounter: Payer: Self-pay | Admitting: Nurse Practitioner

## 2022-05-14 ENCOUNTER — Ambulatory Visit (INDEPENDENT_AMBULATORY_CARE_PROVIDER_SITE_OTHER): Payer: Medicare Other | Admitting: Nurse Practitioner

## 2022-05-14 VITALS — BP 122/78 | HR 54 | Temp 96.5°F | Ht 62.0 in | Wt 162.2 lb

## 2022-05-14 DIAGNOSIS — E782 Mixed hyperlipidemia: Secondary | ICD-10-CM

## 2022-05-14 DIAGNOSIS — E785 Hyperlipidemia, unspecified: Secondary | ICD-10-CM

## 2022-05-14 DIAGNOSIS — E1165 Type 2 diabetes mellitus with hyperglycemia: Secondary | ICD-10-CM | POA: Diagnosis not present

## 2022-05-14 DIAGNOSIS — Z87442 Personal history of urinary calculi: Secondary | ICD-10-CM | POA: Insufficient documentation

## 2022-05-14 DIAGNOSIS — E1169 Type 2 diabetes mellitus with other specified complication: Secondary | ICD-10-CM

## 2022-05-14 LAB — LIPID PANEL
Cholesterol: 101 mg/dL (ref 0–200)
HDL: 36.4 mg/dL — ABNORMAL LOW (ref >39.00)
LDL Cholesterol: 53 mg/dL (ref 0–99)
NonHDL: 64.16
Total CHOL/HDL Ratio: 3
Triglycerides: 57 mg/dL (ref 0.0–149.0)
VLDL: 11.4 mg/dL (ref 0.0–40.0)

## 2022-05-14 LAB — HEPATIC FUNCTION PANEL
ALT: 18 U/L (ref 0–35)
AST: 21 U/L (ref 0–37)
Albumin: 4.1 g/dL (ref 3.5–5.2)
Alkaline Phosphatase: 75 U/L (ref 39–117)
Bilirubin, Direct: 0.2 mg/dL (ref 0.0–0.3)
Total Bilirubin: 0.7 mg/dL (ref 0.2–1.2)
Total Protein: 7 g/dL (ref 6.0–8.3)

## 2022-05-14 LAB — RENAL FUNCTION PANEL
Albumin: 4.1 g/dL (ref 3.5–5.2)
BUN: 15 mg/dL (ref 6–23)
CO2: 26 mEq/L (ref 19–32)
Calcium: 9.4 mg/dL (ref 8.4–10.5)
Chloride: 105 mEq/L (ref 96–112)
Creatinine, Ser: 0.77 mg/dL (ref 0.40–1.20)
GFR: 74.65 mL/min (ref >60.00)
Glucose, Bld: 94 mg/dL (ref 70–99)
Phosphorus: 3.1 mg/dL (ref 2.3–4.6)
Potassium: 4 mEq/L (ref 3.5–5.1)
Sodium: 141 mEq/L (ref 135–145)

## 2022-05-14 MED ORDER — ATORVASTATIN CALCIUM 40 MG PO TABS: 40.0000 mg | ORAL_TABLET | Freq: Every day | ORAL | 3 refills | Status: DC

## 2022-05-14 NOTE — Progress Notes (Signed)
Established Patient Visit  Patient: Heather Winters   DOB: [birth year]   75 y.o. Female  MRN: [id number] Visit Date: 05/14/2022  Subjective:    Chief Complaint  Patient presents with   Office Visit    DM/ Hyperlipidemia F/u  Doesn't check BS No concerns    HPI Type 2 diabetes mellitus with hyperglycemia, without long-term current use of insulin ([place]) She has made dietary modifications under the guidance of the nutritionist. No glucose check at home Will schedule appt for DM eye exam. No neuropathy  Repeat hgbA1c and BMP   BP Readings from Last 3 Encounters:  05/14/22 122/78  02/11/22 126/86  12/24/21 132/82    Wt Readings from Last 3 Encounters:  05/14/22 162 lb 3.2 oz (73.6 kg)  02/22/22 175 lb (79.4 kg)  02/11/22 177 lb (80.3 kg)    Reviewed medical, surgical, and social history today  Medications: Outpatient Medications Prior to Visit  Medication Sig   APPLE CIDER VINEGAR PO Take by mouth.   atorvastatin (LIPITOR) 40 MG tablet Take 1 tablet (40 mg total) by mouth daily at 6 PM.   BIOTIN PO Take 6,000 mcg by mouth.   [DISCONTINUED] fluticasone (FLONASE) 50 MCG/ACT nasal spray Place 2 sprays into both nostrils daily. (Patient not taking: Reported on 05/14/2022)   [DISCONTINUED] naproxen (NAPROSYN) 500 MG tablet Take 1 tablet (500 mg total) by mouth every 12 (twelve) hours as needed. (Patient not taking: Reported on 05/14/2022)   Facility-Administered Medications Prior to Visit  Medication Dose Route Frequency Provider   0.9 %  sodium chloride infusion  500 mL Intravenous Once Jackquline Denmark, MD   Reviewed past medical and social history.   ROS per HPI above      Objective:  BP 122/78 (BP Location: Right Arm, Patient Position: Sitting, Cuff Size: Normal)   Pulse (!) 54   Temp (!) 96.5 F (35.8 C) (Temporal)   Ht '5\' 2"'$  (1.575 m)   Wt 162 lb 3.2 oz (73.6 kg)   SpO2 98%   BMI 29.67 kg/m      Physical Exam Cardiovascular:     Rate and Rhythm:  Normal rate and regular rhythm.     Pulses: Normal pulses.     Heart sounds: Normal heart sounds.  Pulmonary:     Effort: Pulmonary effort is normal.     Breath sounds: Normal breath sounds.  Musculoskeletal:     Right lower leg: No edema.     Left lower leg: No edema.  Neurological:     Mental Status: She is alert and oriented to person, place, and time.  Psychiatric:        Mood and Affect: Mood normal.        Behavior: Behavior normal.        Thought Content: Thought content normal.     No results found for any visits on 05/14/22.    Assessment & Plan:    Problem List Items Addressed This Visit       Endocrine   Hyperlipidemia associated with type 2 diabetes mellitus ([place])   Relevant Orders   Lipid panel   Hepatic function panel   Type 2 diabetes mellitus with hyperglycemia, without long-term current use of insulin ([place]) - Primary    She has made dietary modifications under the guidance of the nutritionist. No glucose check at home Will schedule appt for DM eye exam. No neuropathy  Repeat  hgbA1c and BMP       Relevant Orders   Renal Function Panel   Hemoglobin A1c   Return in about 3 months (around 08/13/2022) for DM, hyperlipidemia (fasting).     Wilfred Lacy, NP

## 2022-05-14 NOTE — Patient Instructions (Signed)
Go to lab [facility] to schedule f/up with nutritionist  Diabetes Mellitus and Nutrition, Adult When you have diabetes, or diabetes mellitus, it is very important to have healthy eating habits because your blood sugar (glucose) levels are greatly affected by what you eat and drink. Eating healthy foods in the right amounts, at about the same times every day, can help you: Manage your blood glucose. Lower your risk of heart disease. Improve your blood pressure. Reach or maintain a healthy weight. What can affect my meal plan? Every person with diabetes is different, and each person has different needs for a meal plan. Your health care provider may recommend that you work with a dietitian to make a meal plan that is best for you. Your meal plan may vary depending on factors such as: The calories you need. The medicines you take. Your weight. Your blood glucose, blood pressure, and cholesterol levels. Your activity level. Other health conditions you have, such as heart or kidney disease. How do carbohydrates affect me? Carbohydrates, also called carbs, affect your blood glucose level more than any other type of food. Eating carbs raises the amount of glucose in your blood. It is important to know how many carbs you can safely have in each meal. This is different for every person. Your dietitian can help you calculate how many carbs you should have at each meal and for each snack. How does alcohol affect me? Alcohol can cause a decrease in blood glucose (hypoglycemia), especially if you use insulin or take certain diabetes medicines by mouth. Hypoglycemia can be a life-threatening condition. Symptoms of hypoglycemia, such as sleepiness, dizziness, and confusion, are similar to symptoms of having too much alcohol. Do not drink alcohol if: Your health care provider tells you not to drink. You are pregnant, may be pregnant, or are planning to become pregnant. If you drink alcohol: Limit how much you have  to: 0-1 drink a day for women. 0-2 drinks a day for men. Know how much alcohol is in your drink. In the [place] one drink equals one 12 oz bottle of beer (355 mL), one 5 oz glass of wine (148 mL), or one 1 oz glass of hard liquor (44 mL). Keep yourself hydrated with water, diet soda, or unsweetened iced tea. Keep in mind that regular soda, juice, and other mixers may contain a lot of sugar and must be counted as carbs. What are tips for following this plan?  Reading food labels Start by checking the serving size on the Nutrition Facts label of packaged foods and drinks. The number of calories and the amount of carbs, fats, and other nutrients listed on the label are based on one serving of the item. Many items contain more than one serving per package. Check the total grams (g) of carbs in one serving. Check the number of grams of saturated fats and trans fats in one serving. Choose foods that have a low amount or none of these fats. Check the number of milligrams (mg) of salt (sodium) in one serving. Most people should limit total sodium intake to less than 2,300 mg per day. Always check the nutrition information of foods labeled as "low-fat" or "nonfat." These foods may be higher in added sugar or refined carbs and should be avoided. Talk to your dietitian to identify your daily goals for nutrients listed on the label. Shopping Avoid buying canned, pre-made, or processed foods. These foods tend to be high in fat, sodium, and added sugar. Shop around the  outside edge of the grocery store. This is where you will most often find fresh fruits and vegetables, bulk grains, fresh meats, and fresh dairy products. Cooking Use low-heat cooking methods, such as baking, instead of high-heat cooking methods, such as deep frying. Cook using healthy oils, such as olive, canola, or sunflower oil. Avoid cooking with butter, cream, or high-fat meats. Meal planning Eat meals and snacks regularly, preferably at  the same times every day. Avoid going long periods of time without eating. Eat foods that are high in fiber, such as fresh fruits, vegetables, beans, and whole grains. Eat 4-6 oz (112-168 g) of lean protein each day, such as lean meat, chicken, fish, eggs, or tofu. One ounce (oz) (28 g) of lean protein is equal to: 1 oz (28 g) of meat, chicken, or fish. 1 egg.  cup (62 g) of tofu. Eat some foods each day that contain healthy fats, such as avocado, nuts, seeds, and fish. What foods should I eat? Fruits Berries. Apples. Oranges. Peaches. Apricots. Plums. Grapes. Mangoes. Papayas. Pomegranates. Kiwi. Cherries. Vegetables Leafy greens, including lettuce, spinach, kale, chard, collard greens, mustard greens, and cabbage. Beets. Cauliflower. Broccoli. Carrots. Green beans. Tomatoes. Peppers. Onions. Cucumbers. Brussels sprouts. Grains Whole grains, such as whole-wheat or whole-grain bread, crackers, tortillas, cereal, and pasta. Unsweetened oatmeal. Quinoa. Brown or wild rice. Meats and other proteins Seafood. Poultry without skin. Lean cuts of poultry and beef. Tofu. Nuts. Seeds. Dairy Low-fat or fat-free dairy products such as milk, yogurt, and cheese. The items listed above may not be a complete list of foods and beverages you can eat and drink. Contact a dietitian for more information. What foods should I avoid? Fruits Fruits canned with syrup. Vegetables Canned vegetables. Frozen vegetables with butter or cream sauce. Grains Refined white flour and flour products such as bread, pasta, snack foods, and cereals. Avoid all processed foods. Meats and other proteins Fatty cuts of meat. Poultry with skin. Breaded or fried meats. Processed meat. Avoid saturated fats. Dairy Full-fat yogurt, cheese, or milk. Beverages Sweetened drinks, such as soda or iced tea. The items listed above may not be a complete list of foods and beverages you should avoid. Contact a dietitian for more  information. Questions to ask a health care provider Do I need to meet with a certified diabetes care and education specialist? Do I need to meet with a dietitian? What number can I call if I have questions? When are the best times to check my blood glucose? Where to find more information: American Diabetes Association: diabetes.org Academy of Nutrition and Dietetics: eatright.[employer] of Diabetes and Digestive and Kidney Diseases: [employer] Association of Diabetes Care & Education Specialists: diabeteseducator.org Summary It is important to have healthy eating habits because your blood sugar (glucose) levels are greatly affected by what you eat and drink. It is important to use alcohol carefully. A healthy meal plan will help you manage your blood glucose and lower your risk of heart disease. Your health care provider may recommend that you work with a dietitian to make a meal plan that is best for you. This information is not intended to replace advice given to you by your health care provider. Make sure you discuss any questions you have with your health care provider. Document Revised: 03/12/2020 Document Reviewed: 03/12/2020 Elsevier Patient Education  [place].

## 2022-05-14 NOTE — Assessment & Plan Note (Addendum)
She has made dietary modifications under the guidance of the nutritionist. No glucose check at home Will schedule appt for DM eye exam. No neuropathy  Repeat hgbA1c and BMP today

## 2022-05-14 NOTE — Assessment & Plan Note (Signed)
No adverse effect with atorvastatin Repeat lipid panel

## 2022-05-15 LAB — HEMOGLOBIN A1C
Hgb A1c MFr Bld: 5.5 % of total Hgb (ref <5.7)
Mean Plasma Glucose: 111 mg/dL
eAG (mmol/L): 6.2 mmol/L

## 2022-06-04 DIAGNOSIS — Z23 Encounter for immunization: Secondary | ICD-10-CM | POA: Diagnosis not present

## 2022-07-28 ENCOUNTER — Telehealth: Payer: Self-pay | Admitting: Nurse Practitioner

## 2022-07-28 ENCOUNTER — Other Ambulatory Visit: Payer: Self-pay | Admitting: Nurse Practitioner

## 2022-07-28 DIAGNOSIS — Z1231 Encounter for screening mammogram for malignant neoplasm of breast: Secondary | ICD-10-CM

## 2022-07-28 NOTE — Telephone Encounter (Signed)
Pt is wanting a referral to get a mammogram to [address]. Please advise pt at   [phone] [facility])

## 2022-07-28 NOTE — Telephone Encounter (Signed)
Referral entered  

## 2022-07-28 NOTE — Addendum Note (Signed)
Addended by: Bynum Bellows L on: 07/28/2022 10:03 AM   Modules accepted: Orders

## 2022-08-12 ENCOUNTER — Encounter: Payer: Self-pay | Admitting: Nurse Practitioner

## 2022-08-12 ENCOUNTER — Ambulatory Visit (INDEPENDENT_AMBULATORY_CARE_PROVIDER_SITE_OTHER): Payer: Medicare Other | Admitting: Nurse Practitioner

## 2022-08-12 VITALS — BP 120/78 | HR 57 | Temp 97.7°F | Ht 63.75 in | Wt 162.8 lb

## 2022-08-12 DIAGNOSIS — E1169 Type 2 diabetes mellitus with other specified complication: Secondary | ICD-10-CM

## 2022-08-12 DIAGNOSIS — E1165 Type 2 diabetes mellitus with hyperglycemia: Secondary | ICD-10-CM | POA: Diagnosis not present

## 2022-08-12 DIAGNOSIS — E785 Hyperlipidemia, unspecified: Secondary | ICD-10-CM

## 2022-08-12 DIAGNOSIS — L608 Other nail disorders: Secondary | ICD-10-CM | POA: Diagnosis not present

## 2022-08-12 DIAGNOSIS — M5416 Radiculopathy, lumbar region: Secondary | ICD-10-CM

## 2022-08-12 LAB — POCT GLYCOSYLATED HEMOGLOBIN (HGB A1C)
HbA1c POC (<> result, manual entry): 5.6 % (ref 4.0–5.6)
Hemoglobin A1C: 5.6 % (ref 4.0–5.6)

## 2022-08-12 NOTE — Patient Instructions (Addendum)
You will be contacted to schedule appt with dermatology Ok to get TDAP vacccine at retail pharmacy. hgbA1c at 5.6%: DM controlled  Back Exercises The following exercises strengthen the muscles that help to support the trunk (torso) and back. They also help to keep the lower back flexible. Doing these exercises can help to prevent or lessen existing low back pain. If you have back pain or discomfort, try doing these exercises 2-3 times each day or as told by your health care provider. As your pain improves, do them once each day, but increase the number of times that you repeat the steps for each exercise (do more repetitions). To prevent the recurrence of back pain, continue to do these exercises once each day or as told by your health care provider. Do exercises exactly as told by your health care provider and adjust them as directed. It is normal to feel mild stretching, pulling, tightness, or discomfort as you do these exercises, but you should stop right away if you feel sudden pain or your pain gets worse. Exercises Single knee to chest Repeat these steps 3-5 times for each leg: Lie on your back on a firm bed or the floor with your legs extended. Bring one knee to your chest. Your other leg should stay extended and in contact with the floor. Hold your knee in place by grabbing your knee or thigh with both hands and hold. Pull on your knee until you feel a gentle stretch in your lower back or buttocks. Hold the stretch for 10-30 seconds. Slowly release and straighten your leg.  Pelvic tilt Repeat these steps 5-10 times: Lie on your back on a firm bed or the floor with your legs extended. Bend your knees so they are pointing toward the ceiling and your feet are flat on the floor. Tighten your lower abdominal muscles to press your lower back against the floor. This motion will tilt your pelvis so your tailbone points up toward the ceiling instead of pointing to your feet or the floor. With  gentle tension and even breathing, hold this position for 5-10 seconds.  Cat-cow Repeat these steps until your lower back becomes more flexible: Get into a hands-and-knees position on a firm bed or the floor. Keep your hands under your shoulders, and keep your knees under your hips. You may place padding under your knees for comfort. Let your head hang down toward your chest. Contract your abdominal muscles and point your tailbone toward the floor so your lower back becomes rounded like the back of a cat. Hold this position for 5 seconds. Slowly lift your head, let your abdominal muscles relax, and point your tailbone up toward the ceiling so your back forms a sagging arch like the back of a cow. Hold this position for 5 seconds.  Press-ups Repeat these steps 5-10 times: Lie on your abdomen (face-down) on a firm bed or the floor. Place your palms near your head, about shoulder-width apart. Keeping your back as relaxed as possible and keeping your hips on the floor, slowly straighten your arms to raise the top half of your body and lift your shoulders. Do not use your back muscles to raise your upper torso. You may adjust the placement of your hands to make yourself more comfortable. Hold this position for 5 seconds while you keep your back relaxed. Slowly return to lying flat on the floor.  Bridges Repeat these steps 10 times: Lie on your back on a firm bed or the floor.  Bend your knees so they are pointing toward the ceiling and your feet are flat on the floor. Your arms should be flat at your sides, next to your body. Tighten your buttocks muscles and lift your buttocks off the floor until your waist is at almost the same height as your knees. You should feel the muscles working in your buttocks and the back of your thighs. If you do not feel these muscles, slide your feet 1-2 inches (2.5-5 cm) farther away from your buttocks. Hold this position for 3-5 seconds. Slowly lower your hips to  the starting position, and allow your buttocks muscles to relax completely. If this exercise is too easy, try doing it with your arms crossed over your chest. Abdominal crunches Repeat these steps 5-10 times: Lie on your back on a firm bed or the floor with your legs extended. Bend your knees so they are pointing toward the ceiling and your feet are flat on the floor. Cross your arms over your chest. Tip your chin slightly toward your chest without bending your neck. Tighten your abdominal muscles and slowly raise your torso high enough to lift your shoulder blades a tiny bit off the floor. Avoid raising your torso higher than that because it can put too much stress on your lower back and does not help to strengthen your abdominal muscles. Slowly return to your starting position.  Back lifts Repeat these steps 5-10 times: Lie on your abdomen (face-down) with your arms at your sides, and rest your forehead on the floor. Tighten the muscles in your legs and your buttocks. Slowly lift your chest off the floor while you keep your hips pressed to the floor. Keep the back of your head in line with the curve in your back. Your eyes should be looking at the floor. Hold this position for 3-5 seconds. Slowly return to your starting position.  Contact a health care provider if: Your back pain or discomfort gets much worse when you do an exercise. Your worsening back pain or discomfort does not lessen within 2 hours after you exercise. If you have any of these problems, stop doing these exercises right away. Do not do them again unless your health care provider says that you can. Get help right away if: You develop sudden, severe back pain. If this happens, stop doing the exercises right away. Do not do them again unless your health care provider says that you can. This information is not intended to replace advice given to you by your health care provider. Make sure you discuss any questions you have  with your health care provider. Document Revised: 02/03/2021 Document Reviewed: 10/22/2020 Elsevier Patient Education  [place].   Hip Exercises Ask your health care provider which exercises are safe for you. Do exercises exactly as told by your health care provider and adjust them as directed. It is normal to feel mild stretching, pulling, tightness, or discomfort as you do these exercises. Stop right away if you feel sudden pain or your pain gets worse. Do not begin these exercises until told by your health care provider. Stretching and range-of-motion exercises These exercises warm up your muscles and joints and improve the movement and flexibility of your hip. These exercises also help to relieve pain, numbness, and tingling. You may be asked to limit your range of motion if you had a hip replacement. Talk to your health care provider about these restrictions. Hamstrings, supine  Lie on your back (supine position). Loop a  belt or towel over the ball of your left / right foot. The ball of your foot is on the walking surface, right under your toes. Straighten your left / right knee and slowly pull on the belt or towel to raise your leg until you feel a gentle stretch behind your knee (hamstring). Do not let your knee bend while you do this. Keep your other leg flat on the floor. Hold this position for __________ seconds. Slowly return your leg to the starting position. Repeat __________ times. Complete this exercise __________ times a day. Hip rotation  Lie on your back on a firm surface. With your left / right hand, gently pull your left / right knee toward the shoulder that is on the same side of the body. Stop when your knee is pointing toward the ceiling. Hold your left / right ankle with your other hand. Keeping your knee steady, gently pull your left / right ankle toward your other shoulder until you feel a stretch in your buttocks. Keep your hips and shoulders firmly  planted while you do this stretch. Hold this position for __________ seconds. Repeat __________ times. Complete this exercise __________ times a day. Seated stretch This exercise is sometimes called hamstrings and adductors stretch. Sit on the floor with your legs stretched wide. Keep your knees straight during this exercise. Keeping your head and back in a straight line, bend at your waist to reach for your left foot (position A). You should feel a stretch in your right inner thigh (adductors). Hold this position for __________ seconds. Then slowly return to the upright position. Keeping your head and back in a straight line, bend at your waist to reach forward (position B). You should feel a stretch behind both of your thighs and knees (hamstrings). Hold this position for __________ seconds. Then slowly return to the upright position. Keeping your head and back in a straight line, bend at your waist to reach for your right foot (position C). You should feel a stretch in your left inner thigh (adductors). Hold this position for __________ seconds. Then slowly return to the upright position. Repeat __________ times. Complete this exercise __________ times a day. Lunge This exercise stretches the muscles of the hip (hip flexors). Place your left / right knee on the floor and bend your other knee so that is directly over your ankle. You should be half-kneeling. Keep good posture with your head over your shoulders. Tighten your buttocks to point your tailbone downward. This will prevent your back from arching too much. You should feel a gentle stretch in the front of your left / right thigh and hip. If you do not feel a stretch, slide your other foot forward slightly and then slowly lunge forward with your chest up until your knee once again lines up over your ankle. Make sure your tailbone continues to point downward. Hold this position for __________ seconds. Slowly return to the starting  position. Repeat __________ times. Complete this exercise __________ times a day. Strengthening exercises These exercises build strength and endurance in your hip. Endurance is the ability to use your muscles for a long time, even after they get tired. Bridge This exercise strengthens the muscles of your hip (hip extensors). Lie on your back on a firm surface with your knees bent and your feet flat on the floor. Tighten your buttocks muscles and lift your bottom off the floor until the trunk of your body and your hips are level with your thighs. Do  not arch your back. You should feel the muscles working in your buttocks and the back of your thighs. If you do not feel these muscles, slide your feet 1-2 inches (2.5-5 cm) farther away from your buttocks. Hold this position for __________ seconds. Slowly lower your hips to the starting position. Let your muscles relax completely between repetitions. Repeat __________ times. Complete this exercise __________ times a day. Straight leg raises, side-lying This exercise strengthens the muscles that move the hip joint away from the center of the body (hip abductors). Lie on your side with your left / right leg in the top position. Lie so your head, shoulder, hip, and knee line up. You may bend your bottom knee slightly to help you balance. Roll your hips slightly forward, so your hips are stacked directly over each other and your left / right knee is facing forward. Leading with your heel, lift your top leg 4-6 inches (10-15 cm). You should feel the muscles in your top hip lifting. Do not let your foot drift forward. Do not let your knee roll toward the ceiling. Hold this position for __________ seconds. Slowly return to the starting position. Let your muscles relax completely between repetitions. Repeat __________ times. Complete this exercise __________ times a day. Straight leg raises, side-lying This exercise strengthens the muscles that move the  hip joint toward the center of the body (hip adductors). Lie on your side with your left / right leg in the bottom position. Lie so your head, shoulder, hip, and knee line up. You may place your upper foot in front to help you balance. Roll your hips slightly forward, so your hips are stacked directly over each other and your left / right knee is facing forward. Tense the muscles in your inner thigh and lift your bottom leg 4-6 inches (10-15 cm). Hold this position for __________ seconds. Slowly return to the starting position. Let your muscles relax completely between repetitions. Repeat __________ times. Complete this exercise __________ times a day. Straight leg raises, supine This exercise strengthens the muscles in the front of your thigh (quadriceps). Lie on your back (supine position) with your left / right leg extended and your other knee bent. Tense the muscles in the front of your left / right thigh. You should see your kneecap slide up or see increased dimpling just above your knee. Keep these muscles tight as you raise your leg 4-6 inches (10-15 cm) off the floor. Do not let your knee bend. Hold this position for __________ seconds. Keep these muscles tense as you lower your leg. Relax the muscles slowly and completely between repetitions. Repeat __________ times. Complete this exercise __________ times a day. Hip abductors, standing This exercise strengthens the muscles that move the leg and hip joint away from the center of the body (hip abductors). Tie one end of a rubber exercise band or tubing to a secure surface, such as a chair, table, or pole. Loop the other end of the band or tubing around your left / right ankle. Keeping your ankle with the band or tubing directly opposite the secured end, step away until there is tension in the tubing or band. Hold on to a chair, table, or pole as needed for balance. Lift your left / right leg out to your side. While you do this: Keep  your back upright. Keep your shoulders over your hips. Keep your toes pointing forward. Make sure to use your hip muscles to slowly lift your leg. Do not  tip your body or forcefully lift your leg. Hold this position for __________ seconds. Slowly return to the starting position. Repeat __________ times. Complete this exercise __________ times a day. Squats This exercise strengthens the muscles in the front of your thigh (quadriceps). Stand in a door frame so your feet and knees are in line with the frame. You may place your hands on the frame for balance. Slowly bend your knees and lower your hips like you are going to sit in a chair. Keep your lower legs in a straight-up-and-down position. Do not let your hips go lower than your knees. Do not bend your knees lower than told by your health care provider. If your hip pain increases, do not bend as low. Hold this position for ___________ seconds. Slowly push with your legs to return to standing. Do not use your hands to pull yourself to standing. Repeat __________ times. Complete this exercise __________ times a day. This information is not intended to replace advice given to you by your health care provider. Make sure you discuss any questions you have with your health care provider. Document Revised: 12/24/2020 Document Reviewed: 12/24/2020 Elsevier Patient Education  [place].

## 2022-08-12 NOTE — Progress Notes (Signed)
Established Patient Visit  Patient: Heather Winters   DOB: 1946/07/21   76 y.o. Female  MRN: [id number] Visit Date: 08/15/2022  Subjective:    Chief Complaint  Patient presents with   Follow-up    DM, hyperlipidemia. Fasting this morning. No other concerns.  Eye exam scheduled for 08/18/22. Needs [place] for Tdap.   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 Entered referral to dermatology  Hyperlipidemia associated with type 2 diabetes mellitus ([place]) LDL at goal with atorvastatin Maintain med dose  Reviewed medical, surgical, and social history today  Medications: Outpatient Medications Prior to Visit  Medication Sig   APPLE CIDER VINEGAR PO Take by mouth.   atorvastatin (LIPITOR) 40 MG tablet Take 1 tablet (40 mg total) by mouth daily at 6 PM.   [DISCONTINUED] BIOTIN PO Take 6,000 mcg by mouth. (Patient not taking: Reported on 08/12/2022)   Facility-Administered Medications Prior to Visit  Medication Dose Route Frequency Provider   0.9 %  sodium chloride infusion  500 mL Intravenous Once Jackquline Denmark, MD   Reviewed past medical and social history.   ROS per HPI above  Last metabolic panel Lab Results  Component Value Date   GLUCOSE 94 05/14/2022   NA 141 05/14/2022   K 4.0 05/14/2022   CL 105 05/14/2022   CO2 26 05/14/2022   BUN 15 05/14/2022   CREATININE 0.77 05/14/2022   GFRNONAA 58 (L) 01/02/2019   CALCIUM 9.4 05/14/2022   PHOS 3.1 05/14/2022   PROT 7.0 05/14/2022   ALBUMIN 4.1 05/14/2022   ALBUMIN 4.1 05/14/2022   BILITOT 0.7 05/14/2022   ALKPHOS 75 05/14/2022   AST 21 05/14/2022   ALT 18 05/14/2022   ANIONGAP 13 01/02/2019   Last lipids Lab Results  Component Value Date   CHOL 101 05/14/2022   HDL 36.40 (L) 05/14/2022   LDLCALC 53 05/14/2022    TRIG 57.0 05/14/2022   CHOLHDL 3 05/14/2022   Last hemoglobin A1c Lab Results  Component Value Date   HGBA1C 5.6 08/12/2022   HGBA1C 5.6 08/12/2022        Objective:  BP 120/78   Pulse (!) 57   Temp 97.7 F (36.5 C) (Temporal)   Ht 5' 3.75" (1.619 m)   Wt 162 lb 12.8 oz (73.8 kg)   SpO2 97%   BMI 28.16 kg/m      Physical Exam  Results for orders placed or performed in visit on 08/12/22  POCT glycosylated hemoglobin (Hb A1C)  Result Value Ref Range   Hemoglobin A1C 5.6 4.0 - 5.6 %   HbA1c POC (<> result, manual entry) 5.6 4.0 - 5.6 %   HbA1c, POC (prediabetic range)     HbA1c, POC (controlled diabetic range)        Assessment & Plan:    Problem List Items Addressed This Visit       Endocrine   Hyperlipidemia associated with type 2 diabetes mellitus ([place])    LDL at goal with atorvastatin Maintain med dose      Type 2 diabetes mellitus with hyperglycemia, without long-term current use of insulin ([place]) - Primary    Repeat hgbA1c: 5.6% stable Continue diabetic diet      Relevant Orders   POCT glycosylated hemoglobin (Hb A1C) (Completed)  Musculoskeletal and Integument   Longitudinal melanonychia    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 Entered referral to dermatology      Relevant Orders   Ambulatory referral to Dermatology   Return in about 6 months (around 02/11/2023) for DM, hyperlipidemia (fasting).     Wilfred Lacy, NP

## 2022-08-15 ENCOUNTER — Encounter: Payer: Self-pay | Admitting: Nurse Practitioner

## 2022-08-15 DIAGNOSIS — L608 Other nail disorders: Secondary | ICD-10-CM | POA: Insufficient documentation

## 2022-08-15 NOTE — Assessment & Plan Note (Signed)
LDL at goal with atorvastatin Maintain med dose

## 2022-08-15 NOTE — Assessment & Plan Note (Deleted)
Repeat lipid panel ?

## 2022-08-15 NOTE — Assessment & Plan Note (Signed)
Repeat hgbA1c: 5.6% stable Continue diabetic diet

## 2022-08-15 NOTE — Assessment & Plan Note (Signed)
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 Entered referral to dermatology

## 2022-08-18 DIAGNOSIS — Z135 Encounter for screening for eye and ear disorders: Secondary | ICD-10-CM | POA: Diagnosis not present

## 2022-08-18 DIAGNOSIS — H35371 Puckering of macula, right eye: Secondary | ICD-10-CM | POA: Diagnosis not present

## 2022-08-18 DIAGNOSIS — E119 Type 2 diabetes mellitus without complications: Secondary | ICD-10-CM | POA: Diagnosis not present

## 2022-08-18 DIAGNOSIS — H43393 Other vitreous opacities, bilateral: Secondary | ICD-10-CM | POA: Diagnosis not present

## 2022-08-18 DIAGNOSIS — H2513 Age-related nuclear cataract, bilateral: Secondary | ICD-10-CM | POA: Diagnosis not present

## 2022-09-01 ENCOUNTER — Ambulatory Visit (HOSPITAL_BASED_OUTPATIENT_CLINIC_OR_DEPARTMENT_OTHER)
Admission: RE | Admit: 2022-09-01 | Discharge: 2022-09-01 | Disposition: A | Discharge status: Home / Self Care | Payer: Medicare Other | Source: Ambulatory Visit | Attending: Nurse Practitioner | Admitting: Nurse Practitioner

## 2022-09-01 ENCOUNTER — Encounter (HOSPITAL_BASED_OUTPATIENT_CLINIC_OR_DEPARTMENT_OTHER): Payer: Self-pay

## 2022-09-01 DIAGNOSIS — Z1231 Encounter for screening mammogram for malignant neoplasm of breast: Secondary | ICD-10-CM | POA: Diagnosis not present

## 2022-09-24 ENCOUNTER — Ambulatory Visit (INDEPENDENT_AMBULATORY_CARE_PROVIDER_SITE_OTHER): Payer: Medicare Other

## 2022-09-24 VITALS — BP 130/80 | HR 64 | Temp 98.2°F | Ht 63.0 in | Wt 165.0 lb

## 2022-09-24 DIAGNOSIS — Z0000 Encounter for general adult medical examination without abnormal findings: Secondary | ICD-10-CM

## 2022-09-24 NOTE — Patient Instructions (Signed)
Heather Winters , Thank you for taking time to come for your Medicare Wellness Visit. I appreciate your ongoing commitment to your health goals. Please review the following plan we discussed and let me know if I can assist you in the future.   These are the goals we discussed:  Goals      DIET - INCREASE WATER INTAKE     Increase physical activity     Patient Stated     Eat healthier     Patient Stated     09/24/2022, wants to keep A1C in check        This is a list of the screening recommended for you and due dates:  Health Maintenance  Topic Date Due   Eye exam for diabetics  Never done   DTaP/Tdap/Td vaccine (1 - Tdap) Never done   Pneumonia Vaccine (1 - PCV) Never done   Flu Shot  11/21/2022*   Hemoglobin A1C  02/11/2023   Yearly kidney health urinalysis for diabetes  02/12/2023   Complete foot exam   02/12/2023   Yearly kidney function blood test for diabetes  05/15/2023   Colon Cancer Screening  07/29/2023   Medicare Annual Wellness Visit  09/25/2023   DEXA scan (bone density measurement)  Completed   Hepatitis C Screening: USPSTF Recommendation to screen - Ages 55-79 yo.  Completed   Zoster (Shingles) Vaccine  Completed   HPV Vaccine  Aged Out   COVID-19 Vaccine  Discontinued  *Topic was postponed. The date shown is not the original due date.    Advanced directives: Advance directive discussed with you today. Even though you declined this today please call our office should you change your mind and we can give you the proper paperwork for you to fill out.  Conditions/risks identified: none  Next appointment: Follow up in one year for your annual wellness visit    Preventive Care 65 Years and Older, Female Preventive care refers to lifestyle choices and visits with your health care provider that can promote health and wellness. What does preventive care include? A yearly physical exam. This is also called an annual well check. Dental exams once or twice a  year. Routine eye exams. Ask your health care provider how often you should have your eyes checked. Personal lifestyle choices, including: Daily care of your teeth and gums. Regular physical activity. Eating a healthy diet. Avoiding tobacco and drug use. Limiting alcohol use. Practicing safe sex. Taking low-dose aspirin every day. Taking vitamin and mineral supplements as recommended by your health care provider. What happens during an annual well check? The services and screenings done by your health care provider during your annual well check will depend on your age, overall health, lifestyle risk factors, and family history of disease. Counseling  Your health care provider may ask you questions about your: Alcohol use. Tobacco use. Drug use. Emotional well-being. Home and relationship well-being. Sexual activity. Eating habits. History of falls. Memory and ability to understand (cognition). Work and work Statistician. Reproductive health. Screening  You may have the following tests or measurements: Height, weight, and BMI. Blood pressure. Lipid and cholesterol levels. These may be checked every 5 years, or more frequently if you are over 90 years old. Skin check. Lung cancer screening. You may have this screening every year starting at age 52 if you have a 30-pack-year history of smoking and currently smoke or have quit within the past 15 years. Fecal occult blood test (FOBT) of the stool. You may  have this test every year starting at age 25. Flexible sigmoidoscopy or colonoscopy. You may have a sigmoidoscopy every 5 years or a colonoscopy every 10 years starting at age 27. Hepatitis C blood test. Hepatitis B blood test. Sexually transmitted disease (STD) testing. Diabetes screening. This is done by checking your blood sugar (glucose) after you have not eaten for a while (fasting). You may have this done every 1-3 years. Bone density scan. This is done to screen for  osteoporosis. You may have this done starting at age 8. Mammogram. This may be done every 1-2 years. Talk to your health care provider about how often you should have regular mammograms. Talk with your health care provider about your test results, treatment options, and if necessary, the need for more tests. Vaccines  Your health care provider may recommend certain vaccines, such as: Influenza vaccine. This is recommended every year. Tetanus, diphtheria, and acellular pertussis (Tdap, Td) vaccine. You may need a Td booster every 10 years. Zoster vaccine. You may need this after age 29. Pneumococcal 13-valent conjugate (PCV13) vaccine. One dose is recommended after age 37. Pneumococcal polysaccharide (PPSV23) vaccine. One dose is recommended after age 65. Talk to your health care provider about which screenings and vaccines you need and how often you need them. This information is not intended to replace advice given to you by your health care provider. Make sure you discuss any questions you have with your health care provider. Document Released: 09/05/2015 Document Revised: 04/28/2016 Document Reviewed: 06/10/2015 Elsevier Interactive Patient Education  2017 [place] Prevention in the Home Falls can cause injuries. They can happen to people of all ages. There are many things you can do to make your home safe and to help prevent falls. What can I do on the outside of my home? Regularly fix the edges of walkways and driveways and fix any cracks. Remove anything that might make you trip as you walk through a door, such as a raised step or threshold. Trim any bushes or trees on the path to your home. Use bright outdoor lighting. Clear any walking paths of anything that might make someone trip, such as rocks or tools. Regularly check to see if handrails are loose or broken. Make sure that both sides of any steps have handrails. Any raised decks and porches should have guardrails on  the edges. Have any leaves, snow, or ice cleared regularly. Use sand or salt on walking paths during winter. Clean up any spills in your garage right away. This includes oil or grease spills. What can I do in the bathroom? Use night lights. Install grab bars by the toilet and in the tub and shower. Do not use towel bars as grab bars. Use non-skid mats or decals in the tub or shower. If you need to sit down in the shower, use a plastic, non-slip stool. Keep the floor dry. Clean up any water that spills on the floor as soon as it happens. Remove soap buildup in the tub or shower regularly. Attach bath mats securely with double-sided non-slip rug tape. Do not have throw rugs and other things on the floor that can make you trip. What can I do in the bedroom? Use night lights. Make sure that you have a light by your bed that is easy to reach. Do not use any sheets or blankets that are too big for your bed. They should not hang down onto the floor. Have a firm chair that has side arms.  You can use this for support while you get dressed. Do not have throw rugs and other things on the floor that can make you trip. What can I do in the kitchen? Clean up any spills right away. Avoid walking on wet floors. Keep items that you use a lot in easy-to-reach places. If you need to reach something above you, use a strong step stool that has a grab bar. Keep electrical cords out of the way. Do not use floor polish or wax that makes floors slippery. If you must use wax, use non-skid floor wax. Do not have throw rugs and other things on the floor that can make you trip. What can I do with my stairs? Do not leave any items on the stairs. Make sure that there are handrails on both sides of the stairs and use them. Fix handrails that are broken or loose. Make sure that handrails are as long as the stairways. Check any carpeting to make sure that it is firmly attached to the stairs. Fix any carpet that is loose  or worn. Avoid having throw rugs at the top or bottom of the stairs. If you do have throw rugs, attach them to the floor with carpet tape. Make sure that you have a light switch at the top of the stairs and the bottom of the stairs. If you do not have them, ask someone to add them for you. What else can I do to help prevent falls? Wear shoes that: Do not have high heels. Have rubber bottoms. Are comfortable and fit you well. Are closed at the toe. Do not wear sandals. If you use a stepladder: Make sure that it is fully opened. Do not climb a closed stepladder. Make sure that both sides of the stepladder are locked into place. Ask someone to hold it for you, if possible. Clearly mark and make sure that you can see: Any grab bars or handrails. First and last steps. Where the edge of each step is. Use tools that help you move around (mobility aids) if they are needed. These include: Canes. Walkers. Scooters. Crutches. Turn on the lights when you go into a dark area. Replace any light bulbs as soon as they burn out. Set up your furniture so you have a clear path. Avoid moving your furniture around. If any of your floors are uneven, fix them. If there are any pets around you, be aware of where they are. Review your medicines with your doctor. Some medicines can make you feel dizzy. This can increase your chance of falling. Ask your doctor what other things that you can do to help prevent falls. This information is not intended to replace advice given to you by your health care provider. Make sure you discuss any questions you have with your health care provider. Document Released: 06/05/2009 Document Revised: 01/15/2016 Document Reviewed: 09/13/2014 Elsevier Interactive Patient Education  2017 [employer].

## 2022-09-24 NOTE — Progress Notes (Signed)
Subjective:   Heather Winters is a 77 y.o. female who presents for Medicare Annual (Subsequent) preventive examination.  Review of Systems     Cardiac Risk Factors include: advanced age (>15mn, >>109women);dyslipidemia;diabetes mellitus     Objective:    Today's Vitals   09/24/22 0803  BP: 130/80  Pulse: 64  Temp: 98.2 F (36.8 C)  TempSrc: Oral  SpO2: 99%  Weight: 165 lb (74.8 kg)  Height: '5\' 3"'$  (1.6 m)   Body mass index is 29.23 kg/m.     09/24/2022    8:12 AM 08/19/2021    3:56 PM 05/20/2020    9:05 AM 05/09/2019    9:20 AM 01/02/2019   12:54 PM 02/22/2018    8:39 AM  Advanced Directives  Does Patient Have a Medical Advance Directive? No No No No No No  Would patient like information on creating a medical advance directive? No - Patient declined No - Patient declined No - Patient declined No - Patient declined No - Guardian declined No - Patient declined    Current Medications (verified) Outpatient Encounter Medications as of 09/24/2022  Medication Sig   atorvastatin (LIPITOR) 40 MG tablet Take 1 tablet (40 mg total) by mouth daily at 6 PM.   Multiple Vitamin (MULTIVITAMIN ADULT PO) Take by mouth.   APPLE CIDER VINEGAR PO Take by mouth. (Patient not taking: Reported on 09/24/2022)   Facility-Administered Encounter Medications as of 09/24/2022  Medication   0.9 %  sodium chloride infusion    Allergies (verified) Patient has no known allergies.   History: Past Medical History:  Diagnosis Date   Abnormal electrocardiogram 10/14/2017   Arthritis    Atypical chest pain 10/14/2017   Cataract    Elevated BP without diagnosis of hypertension 09/16/2017   History of fainting spells of unknown cause    Hyperlipidemia    Past Surgical History:  Procedure Laterality Date   COLONOSCOPY  2010   [place] =normal exam per pt   Family History  Problem Relation Age of Onset   Diabetes Mother    Colon polyps Sister    Diabetes Brother    Diabetes Brother    Diabetes Brother     COPD Father    Heart disease Neg Hx    Colon cancer Neg Hx    Esophageal cancer Neg Hx    Rectal cancer Neg Hx    Stomach cancer Neg Hx    Social History   Socioeconomic History   Marital status: Widowed    Spouse name: Not on file   Number of children: Not on file   Years of education: Not on file   Highest education level: Not on file  Occupational History   Occupation: retired  Tobacco Use   Smoking status: Former    Packs/day: 0.50    Years: 35.00    Total pack years: 17.50    Types: Cigarettes    Quit date: 09/22/2022   Smokeless tobacco: Never   Tobacco comments:    pt states she has all the info  Vaping Use   Vaping Use: Never used  Substance and Sexual Activity   Alcohol use: Not Currently   Drug use: No   Sexual activity: Not Currently  Other Topics Concern   Not on file  Social History Narrative   Not on file   Social Determinants of Health   Financial Resource Strain: Low Risk  (09/24/2022)   Overall Financial Resource Strain (CARDIA)    Difficulty of  Paying Living Expenses: Not hard at all  Food Insecurity: No Food Insecurity (09/24/2022)   Hunger Vital Sign    Worried About Running Out of Food in the Last Year: Never true    Ran Out of Food in the Last Year: Never true  Transportation Needs: No Transportation Needs (09/24/2022)   PRAPARE - Hydrologist (Medical): No    Lack of Transportation (Non-Medical): No  Physical Activity: Sufficiently Active (09/24/2022)   Exercise Vital Sign    Days of Exercise per Week: 3 days    Minutes of Exercise per Session: 50 min  Stress: No Stress Concern Present (09/24/2022)   [place]    Feeling of Stress : Not at all  Social Connections: Moderately Isolated (08/19/2021)   Social Connection and Isolation Panel [NHANES]    Frequency of Communication with Friends and Family: Twice a week    Frequency of Social Gatherings  with Friends and Family: Twice a week    Attends Religious Services: 1 to 4 times per year    Active Member of [employer] or Organizations: No    Attends Archivist Meetings: Never    Marital Status: Widowed    Tobacco Counseling Counseling given: Not Answered Tobacco comments: pt states she has all the info   Clinical Intake:  Pre-visit preparation completed: Yes  Pain : No/denies pain     Nutritional Status: BMI 25 -29 Overweight Nutritional Risks: None Diabetes: Yes  How often do you need to have someone help you when you read instructions, pamphlets, or other written materials from your doctor or pharmacy?: 1 - Never  Diabetic? Yes Nutrition Risk Assessment:  Has the patient had any N/V/D within the last 2 months?  No  Does the patient have any non-healing wounds?  No  Has the patient had any unintentional weight loss or weight gain?  No   Diabetes:  Is the patient diabetic?  Yes  If diabetic, was a CBG obtained today?  No  Did the patient bring in their glucometer from home?  No  How often do you monitor your CBG's? Does not.   Financial Strains and Diabetes Management:  Are you having any financial strains with the device, your supplies or your medication? No .  Does the patient want to be seen by Chronic Care Management for management of their diabetes?  No  Would the patient like to be referred to a Nutritionist or for Diabetic Management?  No   Diabetic Exams:  Diabetic Eye Exam: Completed 07/2022 Diabetic Foot Exam: Completed 02/11/2022   Interpreter Needed?: No  Information entered by :: NAllen LPN   Activities of Daily Living    09/24/2022    8:13 AM  In your present state of health, do you have any difficulty performing the following activities:  Hearing? 1  Comment some hearing loss  Vision? 0  Difficulty concentrating or making decisions? 0  Walking or climbing stairs? 1  Comment due to arthritis in hip  Dressing or bathing? 0   Doing errands, shopping? 0  Preparing Food and eating ? N  Using the Toilet? N  In the past six months, have you accidently leaked urine? N  Do you have problems with loss of bowel control? N  Managing your Medications? N  Managing your Finances? N  Housekeeping or managing your Housekeeping? N    Patient Care Team: Nche, Charlene Brooke, NP as PCP - General (  Internal Medicine) End, Harrell Gave, MD as PCP - Cardiology (Cardiology)  Indicate any recent Medical Services you may have received from other than [facility] providers in the past year (date may be approximate).     Assessment:   This is a routine wellness examination for [facility].  Hearing/Vision screen Vision Screening - Comments:: Regular eye exams, [facility]  Dietary issues and exercise activities discussed: Current Exercise Habits: Home exercise routine, Type of exercise: walking, Time (Minutes): 50, Frequency (Times/Week): 3, Weekly Exercise (Minutes/Week): 150   Goals Addressed             This Visit's Progress    Patient Stated       09/24/2022, wants to keep A1C in check       Depression Screen    09/24/2022    8:12 AM 08/12/2022    9:05 AM 08/19/2021    3:57 PM 08/19/2021    3:52 PM 03/13/2021    8:11 AM 05/20/2020    9:08 AM 05/09/2019    9:21 AM  PHQ 2/9 Scores  PHQ - 2 Score 0 0 0 0 0 1 0    Fall Risk    09/24/2022    8:12 AM 08/12/2022    9:05 AM 12/24/2021    1:23 PM 08/19/2021    3:57 PM 08/13/2021    2:05 PM  [place] in the past year? 0 0 0 0 0  Number falls in past yr: 0 0 0 0 0  Injury with Fall? 0 0 0 0 0  Risk for fall due to : No Fall Risks    No Fall Risks  Follow up Falls prevention discussed;Education provided;Falls evaluation completed   Falls evaluation completed Falls evaluation completed    FALL RISK PREVENTION PERTAINING TO THE HOME:  Any stairs in or around the home? No  If so, are there any without handrails? N/a Home free of loose throw rugs in walkways, pet  beds, electrical cords, etc? Yes  Adequate lighting in your home to reduce risk of falls? Yes   ASSISTIVE DEVICES UTILIZED TO PREVENT FALLS:  Life alert? No  Use of a cane, walker or w/c? No  Grab bars in the bathroom? No  Shower chair or bench in shower? No  Elevated toilet seat or a handicapped toilet? No   TIMED UP AND GO:  Was the test performed? Yes .  Length of time to ambulate 10 feet: 5 sec.   Gait steady and fast without use of assistive device  Cognitive Function:        09/24/2022    8:15 AM 05/20/2020    9:21 AM  6CIT Screen  What Year? 0 points 0 points  What month? 0 points 0 points  What time? 0 points 0 points  Count back from 20 0 points 0 points  Months in reverse 0 points 2 points  Repeat phrase 0 points 2 points  Total Score 0 points 4 points    Immunizations Immunization History  Administered Date(s) Administered   Fluad Quad(high Dose 65+) 05/11/2019, 06/10/2020   Influenza, High Dose Seasonal PF 07/05/2018   Influenza-Unspecified 07/05/2021   Janssen (J&J) SARS-COV-2 Vaccination 11/22/2019   PFIZER(Purple Top)SARS-COV-2 Vaccination 11/30/2019, 06/25/2020, 03/13/2021   Zoster Recombinat (Shingrix) 05/12/2019, 08/06/2019    TDAP status: Due, Education has been provided regarding the importance of this vaccine. Advised may receive this vaccine at local pharmacy or Health Dept. Aware to provide a copy of the vaccination record if  obtained from local pharmacy or Health Dept. Verbalized acceptance and understanding.  Flu Vaccine status: Up to date  Pneumococcal vaccine status: Due, Education has been provided regarding the importance of this vaccine. Advised may receive this vaccine at local pharmacy or Health Dept. Aware to provide a copy of the vaccination record if obtained from local pharmacy or Health Dept. Verbalized acceptance and understanding.  Covid-19 vaccine status: Completed vaccines  Qualifies for Shingles Vaccine? Yes   Zostavax  completed Yes   Shingrix Completed?: Yes  Screening Tests Health Maintenance  Topic Date Due   OPHTHALMOLOGY EXAM  Never done   DTaP/Tdap/Td (1 - Tdap) Never done   Pneumonia Vaccine 68+ Years old (1 - PCV) Never done   INFLUENZA VACCINE  11/21/2022 (Originally 03/23/2022)   HEMOGLOBIN A1C  02/11/2023   Diabetic kidney evaluation - Urine ACR  02/12/2023   FOOT EXAM  02/12/2023   Diabetic kidney evaluation - eGFR measurement  05/15/2023   COLONOSCOPY (Pts 45-105yr Insurance coverage will need to be confirmed)  07/29/2023   Medicare Annual Wellness (AWV)  09/25/2023   DEXA SCAN  Completed   Hepatitis C Screening  Completed   Zoster Vaccines- Shingrix  Completed   HPV VACCINES  Aged Out   COVID-19 Vaccine  Discontinued    Health Maintenance  Health Maintenance Due  Topic Date Due   OPHTHALMOLOGY EXAM  Never done   DTaP/Tdap/Td (1 - Tdap) Never done   Pneumonia Vaccine 78 Years old (1 - PCV) Never done    Colorectal cancer screening: Type of screening: Colonoscopy. Completed 07/28/2020. Repeat every 5 years  Mammogram status: Completed 09/01/2022. Repeat every year  Bone Density status: Completed 06/09/2020.   Lung Cancer Screening: (Low Dose CT Chest recommended if Age 78-80years, 30 pack-year currently smoking OR have quit w/in 15years.) does not qualify.   Lung Cancer Screening Referral: no  Additional Screening:  Hepatitis C Screening: does qualify; Completed 03/13/2021  Vision Screening: Recommended annual ophthalmology exams for early detection of glaucoma and other disorders of the eye. Is the patient up to date with their annual eye exam?  No  Who is the provider or what is the name of the office in which the patient attends annual eye exams? [facility] If pt is not established with a provider, would they like to be referred to a provider to establish care? No .   Dental Screening: Recommended annual dental exams for proper oral hygiene  Community Resource  Referral / Chronic Care Management: CRR required this visit?  No   CCM required this visit?  No      Plan:     I have personally reviewed and noted the following in the patient's chart:   Medical and social history Use of alcohol, tobacco or illicit drugs  Current medications and supplements including opioid prescriptions. Patient is not currently taking opioid prescriptions. Functional ability and status Nutritional status Physical activity Advanced directives List of other physicians Hospitalizations, surgeries, and ER visits in previous 12 months Vitals Screenings to include cognitive, depression, and falls Referrals and appointments  In addition, I have reviewed and discussed with patient certain preventive protocols, quality metrics, and best practice recommendations. A written personalized care plan for preventive services as well as general preventive health recommendations were provided to patient.     NKellie Simmering LPN   23/0/0762  Nurse Notes: none

## 2022-10-21 DIAGNOSIS — L609 Nail disorder, unspecified: Secondary | ICD-10-CM | POA: Diagnosis not present

## 2022-10-21 DIAGNOSIS — L608 Other nail disorders: Secondary | ICD-10-CM | POA: Diagnosis not present

## 2023-02-11 ENCOUNTER — Ambulatory Visit (INDEPENDENT_AMBULATORY_CARE_PROVIDER_SITE_OTHER): Payer: Medicare Other | Admitting: Nurse Practitioner

## 2023-02-11 ENCOUNTER — Encounter: Payer: Self-pay | Admitting: Nurse Practitioner

## 2023-02-11 ENCOUNTER — Telehealth: Payer: Self-pay

## 2023-02-11 VITALS — BP 128/74 | HR 55 | Temp 97.5°F | Resp 16 | Ht 63.0 in | Wt 165.0 lb

## 2023-02-11 DIAGNOSIS — R001 Bradycardia, unspecified: Secondary | ICD-10-CM | POA: Insufficient documentation

## 2023-02-11 DIAGNOSIS — E785 Hyperlipidemia, unspecified: Secondary | ICD-10-CM | POA: Diagnosis not present

## 2023-02-11 DIAGNOSIS — E1169 Type 2 diabetes mellitus with other specified complication: Secondary | ICD-10-CM | POA: Diagnosis not present

## 2023-02-11 DIAGNOSIS — E1165 Type 2 diabetes mellitus with hyperglycemia: Secondary | ICD-10-CM

## 2023-02-11 LAB — RENAL FUNCTION PANEL
Albumin: 4.2 g/dL (ref 3.5–5.2)
BUN: 15 mg/dL (ref 6–23)
CO2: 28 mEq/L (ref 19–32)
Calcium: 9.1 mg/dL (ref 8.4–10.5)
Chloride: 104 mEq/L (ref 96–112)
Creatinine, Ser: 0.71 mg/dL (ref 0.40–1.20)
GFR: 81.86 mL/min (ref >60.00)
Glucose, Bld: 73 mg/dL (ref 70–99)
Phosphorus: 3.5 mg/dL (ref 2.3–4.6)
Potassium: 4 mEq/L (ref 3.5–5.1)
Sodium: 141 mEq/L (ref 135–145)

## 2023-02-11 LAB — MICROALBUMIN / CREATININE URINE RATIO
Creatinine,U: 32.6 mg/dL
Microalb Creat Ratio: 2.1 mg/g (ref 0.0–30.0)
Microalb, Ur: <0.7 mg/dL (ref 0.0–1.9)

## 2023-02-11 LAB — LIPID PANEL
Cholesterol: 113 mg/dL (ref 0–200)
HDL: 49.7 mg/dL (ref >39.00)
LDL Cholesterol: 52 mg/dL (ref 0–99)
NonHDL: 63.47
Total CHOL/HDL Ratio: 2
Triglycerides: 57 mg/dL (ref 0.0–149.0)
VLDL: 11.4 mg/dL (ref 0.0–40.0)

## 2023-02-11 LAB — HEMOGLOBIN A1C: Hgb A1c MFr Bld: 5.6 % (ref 4.6–6.5)

## 2023-02-11 MED ORDER — ATORVASTATIN CALCIUM 40 MG PO TABS
40.0000 mg | ORAL_TABLET | Freq: Every day | ORAL | 3 refills | Status: DC
Start: 2023-02-11 — End: 2023-09-16

## 2023-02-11 NOTE — Patient Instructions (Signed)
Go to lab Get TDAP vaccine from retail pharmacy Use voltaren gel OVER THE COUNTER for neck pain

## 2023-02-11 NOTE — Assessment & Plan Note (Addendum)
Controlled with diet Repeat hgbA1c, UACr, and bmp

## 2023-02-11 NOTE — Assessment & Plan Note (Signed)
Repeat lipid panel °Maintain lipitor dose °

## 2023-02-11 NOTE — Patient Instructions (Signed)
Visit Information  Thank you for taking time to visit with me today. Please don't hesitate to contact me if I can be of assistance to you.   Following are the goals we discussed today:   Goals Addressed             This Visit's Progress    COMPLETED: Care Coordination Activities-No follow up required       Care Coordination Interventions: Discussed [facility] services and support. Assessed SDOH. Advised to discuss with primary care physician if services needed in the future.         If you are experiencing a Mental Health or Behavioral Health Crisis or need someone to talk to, please call the Suicide and Crisis Lifeline: 988   Patient verbalizes understanding of instructions and care plan provided today and agrees to view in MyChart. Active MyChart status and patient understanding of how to access instructions and care plan via MyChart confirmed with patient.     The patient has been provided with contact information for the care management team and has been advised to call with any health related questions or concerns.   AmeLie Hollars J Jahzir Strohmeier, RN, MSN [facility] Care Management Care Management Coordinator Direct Line [phone]     

## 2023-02-11 NOTE — Progress Notes (Signed)
Established Patient Visit  Patient: Heather Winters   DOB: 1945-12-15   [name]  MRN: [id number] Visit Date: 02/11/2023  Subjective:    Chief Complaint  Patient presents with   Follow-up    Follow up   HPI Sinus bradycardia Repeat ECG today: no change  Hyperlipidemia associated with type 2 diabetes mellitus (HCC) Repeat lipid panel Maintain lipitor dose  Type 2 diabetes mellitus with hyperglycemia, without long-term current use of insulin (HCC) Controlled with diet Repeat hgbA1c, UACr, and bmp   Reviewed medical, surgical, and social history today  Medications: Outpatient Medications Prior to Visit  Medication Sig   APPLE CIDER VINEGAR PO Take by mouth.   Multiple Vitamin (MULTIVITAMIN ADULT PO) Take by mouth.   [DISCONTINUED] atorvastatin (LIPITOR) 40 MG tablet Take 1 tablet (40 mg total) by mouth daily at 6 PM.   Facility-Administered Medications Prior to Visit  Medication Dose Route Frequency Provider   0.9 %  sodium chloride infusion  500 mL Intravenous Once Lynann Bologna, MD   Reviewed past medical and social history.   ROS per HPI above      Objective:  BP 128/74 (BP Location: Left Arm, Patient Position: Sitting, Cuff Size: Normal)   Pulse (!) 55   Temp (!) 97.5 F (36.4 C) (Oral)   Resp 16   Ht 5\' 3"  (1.6 m)   Wt 165 lb (74.8 kg)   SpO2 98%   BMI 29.23 kg/m      Physical Exam Cardiovascular:     Rate and Rhythm: Normal rate and regular rhythm.     Pulses: Normal pulses.          Dorsalis pedis pulses are 2+ on the right side and 2+ on the left side.       Posterior tibial pulses are 2+ on the right side and 2+ on the left side.     Heart sounds: Normal heart sounds.  Pulmonary:     Effort: Pulmonary effort is normal.     Breath sounds: Normal breath sounds.  Musculoskeletal:     Right lower leg: No edema.     Left lower leg: No edema.     Right foot: Normal range of motion. No prominent metatarsal heads.     Left foot:  Normal range of motion. No prominent metatarsal heads.  Feet:     Right foot:     Protective Sensation: 6 sites tested.  6 sites sensed.     Skin integrity: Skin integrity normal.     Toenail Condition: Right toenails are normal.     Left foot:     Protective Sensation: 6 sites tested.  6 sites sensed.     Skin integrity: Skin integrity normal.     Toenail Condition: Left toenails are normal.  Neurological:     Mental Status: She is alert and oriented to person, place, and time.     No results found for any visits on 02/11/23.    Assessment & Plan:    Problem List Items Addressed This Visit       Cardiovascular and Mediastinum   Sinus bradycardia    Repeat ECG today: no change      Relevant Medications   atorvastatin (LIPITOR) 40 MG tablet   Other Relevant Orders   EKG 12-Lead (Completed)   EKG 12-Lead (Completed)     Endocrine   Hyperlipidemia associated with type 2 diabetes  mellitus (HCC)    Repeat lipid panel Maintain lipitor dose      Relevant Medications   atorvastatin (LIPITOR) 40 MG tablet   Other Relevant Orders   Lipid panel   Type 2 diabetes mellitus with hyperglycemia, without long-term current use of insulin (HCC) - Primary    Controlled with diet Repeat hgbA1c, UACr, and bmp      Relevant Medications   atorvastatin (LIPITOR) 40 MG tablet   Other Relevant Orders   Urine microalbumin-creatinine with uACR   Renal function panel with eGFR   Hemoglobin A1c   Other Visit Diagnoses     Mixed hyperlipidemia       Relevant Medications   atorvastatin (LIPITOR) 40 MG tablet      Return in about 6 months (around 08/13/2023) for HTN, DM, hyperlipidemia (fasting).     Alysia Penna, NP

## 2023-02-11 NOTE — Patient Outreach (Signed)
  Care Coordination   In Person Provider Office Visit Note   02/11/2023 Name: ZAELYN NOACK MRN: [id number] DOB: 1946/06/24  Marijo Conception is a 77 y.o. year old female who sees Nche, Bonna Gains, NP for primary care. I engaged with Marijo Conception in the providers office today.  What matters to the patients health and wellness today?  none    Goals Addressed             This Visit's Progress    COMPLETED: Care Coordination Activities-No follow up required       Care Coordination Interventions: Discussed [facility] services and support. Assessed SDOH. Advised to discuss with primary care physician if services needed in the future.         SDOH assessments and interventions completed:  Yes  SDOH Interventions Today    Flowsheet Row Most Recent Value  SDOH Interventions   Food Insecurity Interventions Intervention Not Indicated  Housing Interventions Intervention Not Indicated  Transportation Interventions Intervention Not Indicated        Care Coordination Interventions:  Yes, provided   Follow up plan: No further intervention required.   Encounter Outcome:  Pt. Visit Completed   Bary Leriche, RN, MSN [facility] Care Management Care Management Coordinator Direct Line [phone]

## 2023-02-11 NOTE — Assessment & Plan Note (Signed)
Repeat ECG today: no change

## 2023-02-15 NOTE — Progress Notes (Signed)
Stable Follow instructions as discussed during office visit.

## 2023-06-13 ENCOUNTER — Ambulatory Visit (INDEPENDENT_AMBULATORY_CARE_PROVIDER_SITE_OTHER): Payer: Medicare Other | Admitting: Nurse Practitioner

## 2023-06-13 ENCOUNTER — Encounter: Payer: Self-pay | Admitting: Nurse Practitioner

## 2023-06-13 VITALS — BP 135/78 | HR 63 | Temp 97.9°F | Ht 63.0 in | Wt 169.2 lb

## 2023-06-13 DIAGNOSIS — Z23 Encounter for immunization: Secondary | ICD-10-CM | POA: Diagnosis not present

## 2023-06-13 DIAGNOSIS — L570 Actinic keratosis: Secondary | ICD-10-CM | POA: Diagnosis not present

## 2023-06-13 DIAGNOSIS — M79671 Pain in right foot: Secondary | ICD-10-CM | POA: Diagnosis not present

## 2023-06-13 NOTE — Progress Notes (Signed)
Established Patient Visit  Patient: Heather Winters   DOB: June 19, 1946   [name]  MRN: [id number] Visit Date: 06/13/2023  Subjective:   No chief complaint on file.  Rash This is a chronic problem. The current episode started more than 1 year ago. The problem is unchanged. The affected locations include the left lower leg and right lower leg. She was exposed to nothing. Pertinent negatives include no nail changes. (itching) Past treatments include nothing. There is no history of allergies, asthma, eczema or varicella.   No problem-specific Assessment & Plan notes found for this encounter.   Reviewed medical, surgical, and social history today  Medications: Outpatient Medications Prior to Visit  Medication Sig   atorvastatin (LIPITOR) 40 MG tablet Take 1 tablet (40 mg total) by mouth daily at 6 PM.   Multiple Vitamin (MULTIVITAMIN ADULT PO) Take by mouth.   [DISCONTINUED] APPLE CIDER VINEGAR PO Take by mouth. (Patient not taking: Reported on 06/13/2023)   Facility-Administered Medications Prior to Visit  Medication Dose Route Frequency Provider   0.9 %  sodium chloride infusion  500 mL Intravenous Once Lynann Bologna, MD   Reviewed past medical and social history.   ROS per HPI above      Objective:  BP 135/78 (BP Location: Right Arm)   Pulse 63   Temp 97.9 F (36.6 C) (Temporal)   Ht 5\' 3"  (1.6 m)   Wt 169 lb 3.2 oz (76.7 kg)   SpO2 97%   BMI 29.97 kg/m      Physical Exam Vitals and nursing note reviewed.  Cardiovascular:     Pulses:          Dorsalis pedis pulses are 2+ on the right side and 2+ on the left side.       Posterior tibial pulses are 2+ on the right side and 2+ on the left side.  Musculoskeletal:     Right lower leg: No edema.     Left lower leg: No edema.     Right foot: Normal range of motion. No bunion or prominent metatarsal heads.     Left foot: Normal range of motion. No bunion or prominent metatarsal heads.        Feet:  Feet:     Right foot:     Protective Sensation: 6 sites tested.  6 sites sensed.     Skin integrity: Skin integrity normal.     Toenail Condition: Right toenails are normal.     Left foot:     Protective Sensation: 6 sites tested.  6 sites sensed.     Toenail Condition: Left toenails are normal.  Skin:    Findings: Rash present. Rash is papular and scaling.  Neurological:     Mental Status: She is alert.     No results found for any visits on 06/13/23.    Assessment & Plan:    Problem List Items Addressed This Visit   None Visit Diagnoses     Right foot pain    -  Primary   Relevant Orders   Ambulatory referral to Dermatology   Immunization due       Relevant Orders   Flu Vaccine Trivalent High Dose (Fluad) (Completed)   Pneumococcal conjugate vaccine 20-valent (Prevnar 20) (Completed)   Actinic keratosis       Relevant Orders   Ambulatory referral to Podiatry      Return in  about 3 months (around 09/13/2023) for HTN, DM, hyperlipidemia (fasting).     Alysia Penna, NP

## 2023-06-13 NOTE — Assessment & Plan Note (Signed)
Chronic forefoot pain. Plantar surface, between 2nd and 3rd MTP Denies any injury.  Morton neuroma vs arthritis? Entered referral to podiatry Advised to avoid shoes with inadequate support

## 2023-06-13 NOTE — Patient Instructions (Addendum)
You will be contacted to schedule appointment with podiatry and dermatology.

## 2023-06-28 ENCOUNTER — Ambulatory Visit (INDEPENDENT_AMBULATORY_CARE_PROVIDER_SITE_OTHER): Payer: Medicare Other

## 2023-06-28 ENCOUNTER — Encounter: Payer: Self-pay | Admitting: Podiatry

## 2023-06-28 ENCOUNTER — Ambulatory Visit (INDEPENDENT_AMBULATORY_CARE_PROVIDER_SITE_OTHER): Payer: Medicare Other | Admitting: Podiatry

## 2023-06-28 ENCOUNTER — Other Ambulatory Visit: Payer: Self-pay | Admitting: Podiatry

## 2023-06-28 DIAGNOSIS — M778 Other enthesopathies, not elsewhere classified: Secondary | ICD-10-CM

## 2023-06-28 MED ORDER — METHYLPREDNISOLONE 4 MG PO TBPK
ORAL_TABLET | ORAL | 0 refills | Status: DC
Start: 2023-06-28 — End: 2023-09-16

## 2023-06-28 MED ORDER — TRIAMCINOLONE ACETONIDE 40 MG/ML IJ SUSP
20.0000 mg | Freq: Once | INTRAMUSCULAR | Status: AC
Start: 2023-06-28 — End: 2023-06-28
  Administered 2023-06-28: 20 mg

## 2023-06-28 MED ORDER — MELOXICAM 15 MG PO TABS
15.0000 mg | ORAL_TABLET | Freq: Every day | ORAL | 3 refills | Status: DC
Start: 2023-06-28 — End: 2024-03-15

## 2023-06-28 NOTE — Progress Notes (Signed)
  Subjective:  Patient ID: Heather Winters, female    DOB: [birth year],  MRN: [id number] HPI Chief Complaint  Patient presents with   Foot Pain    Plantar forefoot right - aching x few months, no injury, worse when walking, sometimes feels pain radiating to 2nd, no treatment   New Patient (Initial Visit)    77 y.o. female presents with the above complaint.   ROS: Denies fever chills nausea vomit muscle aches pains calf pain back pain chest pain shortness of breath.  States that she has some mild hip pain.  Past Medical History:  Diagnosis Date   Abnormal electrocardiogram 10/14/2017   Arthritis    Atypical chest pain 10/14/2017   Cataract    Elevated BP without diagnosis of hypertension 09/16/2017   History of fainting spells of unknown cause    Hyperlipidemia    Past Surgical History:  Procedure Laterality Date   COLONOSCOPY  2010   [place] =normal exam per pt    Current Outpatient Medications:    meloxicam (MOBIC) 15 MG tablet, Take 1 tablet (15 mg total) by mouth daily., Disp: 30 tablet, Rfl: 3   methylPREDNISolone (MEDROL DOSEPAK) 4 MG TBPK tablet, 6 day dose pack - take as directed, Disp: 21 tablet, Rfl: 0   atorvastatin (LIPITOR) 40 MG tablet, Take 1 tablet (40 mg total) by mouth daily at 6 PM., Disp: 90 tablet, Rfl: 3   Multiple Vitamin (MULTIVITAMIN ADULT PO), Take by mouth., Disp: , Rfl:   Current Facility-Administered Medications:    0.9 %  sodium chloride infusion, 500 mL, Intravenous, Once, Lynann Bologna, MD  No Known Allergies Review of Systems Objective:  There were no vitals filed for this visit.  General: Well developed, nourished, in no acute distress, alert and oriented x3   Dermatological: Skin is warm, dry and supple bilateral. Nails x 10 are well maintained; remaining integument appears unremarkable at this time. There are no open sores, no preulcerative lesions, no rash or signs of infection present.  Vascular: Dorsalis Pedis artery and Posterior Tibial  artery pedal pulses are 2/4 bilateral with immedate capillary fill time. Pedal hair growth present. No varicosities and no lower extremity edema present bilateral.   Neruologic: Grossly intact via light touch bilateral. Vibratory intact via tuning fork bilateral. Protective threshold with Semmes Wienstein monofilament intact to all pedal sites bilateral. Patellar and Achilles deep tendon reflexes 2+ bilateral. No Babinski or clonus noted bilateral.   Musculoskeletal: No gross boney pedal deformities bilateral. No pain, crepitus, or limitation noted with foot and ankle range of motion bilateral. Muscular strength 5/5 in all groups tested bilateral.  Mild swelling over the second metatarsophalangeal joint of the right foot with pain on end range of motion with dorsiflexion and plantarflexion of the second metatarsal phalangeal joint.  Gait: Unassisted, Nonantalgic.    Radiographs:  Radiographs taken today demonstrate an osseously mature individual good bone mineralization I see no signs of osteoarthritic change no fractures no acute findings.  Assessment & Plan:   Assessment: Capsulitis second metatarsophalangeal joint right foot.  Fat pad atrophy.  Plan: Discussed etiology pathology conservative surgical therapies.  At this point injected periarticular with 10 mg of Kenalog and local anesthetic.  Start her on methylprednisolone to be followed by meloxicam.  Discussed appropriate shoe gear stretching exercise ice therapy sugar modifications I will follow-up with her in 1 month.     Samar Venneman T. [place], [place]

## 2023-07-27 ENCOUNTER — Ambulatory Visit (INDEPENDENT_AMBULATORY_CARE_PROVIDER_SITE_OTHER): Payer: Medicare Other | Admitting: Dermatology

## 2023-07-27 ENCOUNTER — Encounter: Payer: Self-pay | Admitting: Dermatology

## 2023-07-27 VITALS — BP 136/88 | HR 63

## 2023-07-27 DIAGNOSIS — D492 Neoplasm of unspecified behavior of bone, soft tissue, and skin: Secondary | ICD-10-CM | POA: Diagnosis not present

## 2023-07-27 DIAGNOSIS — L821 Other seborrheic keratosis: Secondary | ICD-10-CM | POA: Diagnosis not present

## 2023-07-27 DIAGNOSIS — D485 Neoplasm of uncertain behavior of skin: Secondary | ICD-10-CM

## 2023-07-27 DIAGNOSIS — L570 Actinic keratosis: Secondary | ICD-10-CM | POA: Diagnosis not present

## 2023-07-27 NOTE — Progress Notes (Signed)
   New Patient Visit   Subjective  Heather Winters is a 77 y.o. female who presents for the following: growth on left lower leg Pt has growth on her left lower leg that was raised but is now more flattened she'd like evaluated. Lesion is tender and irritated.  The following portions of the chart were reviewed this encounter and updated as appropriate: medications, allergies, medical history  Review of Systems:  No other skin or systemic complaints except as noted in HPI or Assessment and Plan.  Objective  Well appearing patient in no apparent distress; mood and affect are within normal limits.  A focused examination was performed of the following areas: Left lower leg  Relevant exam findings are noted in the Assessment and Plan.  Left Lower Leg Crusted brown macule        Assessment & Plan    Neoplasm of uncertain behavior of skin Left Lower Leg  Skin / nail biopsy Type of biopsy: tangential   Timeout: patient name, date of birth, surgical site, and procedure verified   Procedure prep:  Patient was prepped and draped in usual sterile fashion Prep type:  Isopropyl alcohol Instrument used: DermaBlade   Hemostasis achieved with: pressure and aluminum chloride   Outcome: patient tolerated procedure well   Post-procedure details: sterile dressing applied and wound care instructions given   Dressing type: pressure dressing    Specimen 1 - Surgical pathology Differential Diagnosis: R/O ISK vs other  Check Margins: No   Return if symptoms worsen or fail to improve.  I, Tillie Fantasia, CMA, am acting as scribe for Gwenith Daily, MD.   Documentation: I have reviewed the above documentation for accuracy and completeness, and I agree with the above.  Gwenith Daily, MD

## 2023-07-27 NOTE — Patient Instructions (Addendum)

## 2023-07-29 LAB — SURGICAL PATHOLOGY

## 2023-08-02 ENCOUNTER — Ambulatory Visit: Payer: Medicare Other | Admitting: Podiatry

## 2023-09-01 LAB — HM DIABETES EYE EXAM

## 2023-09-09 ENCOUNTER — Other Ambulatory Visit (HOSPITAL_BASED_OUTPATIENT_CLINIC_OR_DEPARTMENT_OTHER): Payer: Self-pay | Admitting: Nurse Practitioner

## 2023-09-09 DIAGNOSIS — Z1231 Encounter for screening mammogram for malignant neoplasm of breast: Secondary | ICD-10-CM

## 2023-09-12 ENCOUNTER — Ambulatory Visit: Payer: Medicare Other | Admitting: Nurse Practitioner

## 2023-09-12 ENCOUNTER — Encounter (HOSPITAL_BASED_OUTPATIENT_CLINIC_OR_DEPARTMENT_OTHER): Payer: Self-pay

## 2023-09-12 ENCOUNTER — Ambulatory Visit (HOSPITAL_BASED_OUTPATIENT_CLINIC_OR_DEPARTMENT_OTHER)
Admission: RE | Admit: 2023-09-12 | Discharge: 2023-09-12 | Disposition: A | Discharge status: Home / Self Care | Payer: Medicare Other | Source: Ambulatory Visit | Attending: Nurse Practitioner | Admitting: Nurse Practitioner

## 2023-09-12 DIAGNOSIS — Z1231 Encounter for screening mammogram for malignant neoplasm of breast: Secondary | ICD-10-CM | POA: Insufficient documentation

## 2023-09-14 ENCOUNTER — Ambulatory Visit: Payer: Medicare Other | Admitting: Nurse Practitioner

## 2023-09-16 ENCOUNTER — Encounter: Payer: Self-pay | Admitting: Nurse Practitioner

## 2023-09-16 ENCOUNTER — Ambulatory Visit (INDEPENDENT_AMBULATORY_CARE_PROVIDER_SITE_OTHER): Payer: Medicare Other | Admitting: Nurse Practitioner

## 2023-09-16 VITALS — BP 120/80 | HR 61 | Temp 98.2°F | Ht 63.0 in | Wt 170.4 lb

## 2023-09-16 DIAGNOSIS — E1165 Type 2 diabetes mellitus with hyperglycemia: Secondary | ICD-10-CM

## 2023-09-16 DIAGNOSIS — K635 Polyp of colon: Secondary | ICD-10-CM | POA: Insufficient documentation

## 2023-09-16 DIAGNOSIS — E785 Hyperlipidemia, unspecified: Secondary | ICD-10-CM

## 2023-09-16 DIAGNOSIS — E1169 Type 2 diabetes mellitus with other specified complication: Secondary | ICD-10-CM

## 2023-09-16 LAB — POCT GLYCOSYLATED HEMOGLOBIN (HGB A1C): Hemoglobin A1C: 5.8 % — AB (ref 4.0–5.6)

## 2023-09-16 MED ORDER — ATORVASTATIN CALCIUM 40 MG PO TABS: 40.0000 mg | ORAL_TABLET | Freq: Every day | ORAL | 3 refills | Status: DC

## 2023-09-16 NOTE — Progress Notes (Signed)
Established Patient Visit  Patient: Heather Winters   DOB: [birth year]   78 y.o. Female  MRN: [id number] Visit Date: 09/16/2023  Subjective:    Chief Complaint  Patient presents with   Diabetes    Follow up, no concerns, patient is fasting   Diabetes   Polyp of colon Last colonoscopy 2021: precancerous polyps Repeat in 47yrs recommended. She agreed to GI referral  Hyperlipidemia associated with type 2 diabetes mellitus (HCC) No adverse effects with atorvastatin Maintain med dose  Type 2 diabetes mellitus with hyperglycemia, without long-term current use of insulin (HCC) Controlled with diet Repeat hgbA1c: 5.8%  Reviewed medical, surgical, and social history today  Medications: Outpatient Medications Prior to Visit  Medication Sig   meloxicam (MOBIC) 15 MG tablet Take 1 tablet (15 mg total) by mouth daily.   Multiple Vitamin (MULTIVITAMIN ADULT PO) Take by mouth.   [DISCONTINUED] atorvastatin (LIPITOR) 40 MG tablet Take 1 tablet (40 mg total) by mouth daily at 6 PM.   [DISCONTINUED] methylPREDNISolone (MEDROL DOSEPAK) 4 MG TBPK tablet 6 day dose pack - take as directed (Patient not taking: Reported on 09/16/2023)   Facility-Administered Medications Prior to Visit  Medication Dose Route Frequency Provider   0.9 %  sodium chloride infusion  500 mL Intravenous Once Lynann Bologna, MD   Reviewed past medical and social history.   ROS per HPI above      Objective:  BP 120/80 (BP Location: Left Arm, Patient Position: Sitting, Cuff Size: Normal)   Pulse 61   Temp 98.2 F (36.8 C)   Ht 5\' 3"  (1.6 m)   Wt 170 lb 6.4 oz (77.3 kg)   SpO2 100%   BMI 30.19 kg/m      Physical Exam Cardiovascular:     Rate and Rhythm: Normal rate and regular rhythm.     Pulses: Normal pulses.     Heart sounds: Normal heart sounds.  Pulmonary:     Effort: Pulmonary effort is normal.     Breath sounds: Normal breath sounds.  Musculoskeletal:     Right lower leg: No edema.      Left lower leg: No edema.  Neurological:     Mental Status: She is alert and oriented to person, place, and time.     Results for orders placed or performed in visit on 09/16/23  POCT glycosylated hemoglobin (Hb A1C)  Result Value Ref Range   Hemoglobin A1C 5.8 (A) 4.0 - 5.6 %   HbA1c POC (<> result, manual entry)     HbA1c, POC (prediabetic range)     HbA1c, POC (controlled diabetic range)        Assessment & Plan:    Problem List Items Addressed This Visit     Hyperlipidemia associated with type 2 diabetes mellitus (HCC)   No adverse effects with atorvastatin Maintain med dose      Relevant Medications   atorvastatin (LIPITOR) 40 MG tablet   Polyp of colon   Last colonoscopy 2021: precancerous polyps Repeat in 70yrs recommended. She agreed to GI referral      Relevant Orders   Ambulatory referral to Gastroenterology   Type 2 diabetes mellitus with hyperglycemia, without long-term current use of insulin (HCC) - Primary   Controlled with diet Repeat hgbA1c: 5.8%      Relevant Medications   atorvastatin (LIPITOR) 40 MG tablet   Other Relevant Orders   POCT glycosylated hemoglobin (Hb  A1C) (Completed)   Return in about 6 months (around 03/15/2024) for DM, hyperlipidemia (fasting).     Alysia Penna, NP

## 2023-09-16 NOTE — Patient Instructions (Addendum)
hgbA1c at 5.8% DIABETES is controlled. Maintain current medications

## 2023-09-16 NOTE — Assessment & Plan Note (Signed)
Last colonoscopy 2021: precancerous polyps Repeat in 4yrs recommended. She agreed to GI referral

## 2023-09-16 NOTE — Assessment & Plan Note (Signed)
Controlled with diet Repeat hgbA1c: 5.8%

## 2023-09-16 NOTE — Assessment & Plan Note (Signed)
No adverse effects with atorvastatin Maintain med dose

## 2023-09-23 ENCOUNTER — Encounter: Payer: Self-pay | Admitting: Gastroenterology

## 2023-10-10 ENCOUNTER — Ambulatory Visit (INDEPENDENT_AMBULATORY_CARE_PROVIDER_SITE_OTHER): Payer: Medicare Other

## 2023-10-10 VITALS — BP 110/70 | HR 71 | Temp 98.0°F | Ht 63.5 in | Wt 170.2 lb

## 2023-10-10 DIAGNOSIS — Z0000 Encounter for general adult medical examination without abnormal findings: Secondary | ICD-10-CM

## 2023-10-10 NOTE — Patient Instructions (Signed)
Ms. Heather Winters , Thank you for taking time to come for your Medicare Wellness Visit. I appreciate your ongoing commitment to your health goals. Please review the following plan we discussed and let me know if I can assist you in the future.   Referrals/Orders/Follow-Ups/Clinician Recommendations: none  This is a list of the screening recommended for you and due dates:  Health Maintenance  Topic Date Due   DTaP/Tdap/Td vaccine (1 - Tdap) Never done   Colon Cancer Screening  07/29/2023   Yearly kidney function blood test for diabetes  02/11/2024   Yearly kidney health urinalysis for diabetes  02/11/2024   Hemoglobin A1C  03/15/2024   Complete foot exam   06/12/2024   Eye exam for diabetics  08/31/2024   Medicare Annual Wellness Visit  10/09/2024   Pneumonia Vaccine  Completed   Flu Shot  Completed   DEXA scan (bone density measurement)  Completed   Hepatitis C Screening  Completed   Zoster (Shingles) Vaccine  Completed   HPV Vaccine  Aged Out   COVID-19 Vaccine  Discontinued    Advanced directives: (Declined) Advance directive discussed with you today. Even though you declined this today, please call our office should you change your mind, and we can give you the proper paperwork for you to fill out.  Next Medicare Annual Wellness Visit scheduled for next year: Yes  insert Preventive Care attachment Insert FALL PREVENTION attachment if needed

## 2023-10-10 NOTE — Progress Notes (Signed)
Subjective:   Heather Winters is a 78 y.o. female who presents for Medicare Annual (Subsequent) preventive examination.  Visit Complete: In person    Cardiac Risk Factors include: advanced age (>21men, >50 women);diabetes mellitus;dyslipidemia     Objective:    Today's Vitals   10/10/23 0840  BP: 110/70  Pulse: 71  Temp: 98 F (36.7 C)  TempSrc: Oral  SpO2: 98%  Weight: 170 lb 3.2 oz (77.2 kg)  Height: 5' 3.5" (1.613 m)   Body mass index is 29.68 kg/m.     10/10/2023    8:48 AM 09/24/2022    8:12 AM 08/19/2021    3:56 PM 05/20/2020    9:05 AM 05/09/2019    9:20 AM 01/02/2019   12:54 PM 02/22/2018    8:39 AM  Advanced Directives  Does Patient Have a Medical Advance Directive? No No No No No No No  Would patient like information on creating a medical advance directive? No - Patient declined No - Patient declined No - Patient declined No - Patient declined No - Patient declined No - Guardian declined No - Patient declined    Current Medications (verified) Outpatient Encounter Medications as of 10/10/2023  Medication Sig   atorvastatin (LIPITOR) 40 MG tablet Take 1 tablet (40 mg total) by mouth daily at 6 PM.   meloxicam (MOBIC) 15 MG tablet Take 1 tablet (15 mg total) by mouth daily.   Multiple Vitamin (MULTIVITAMIN ADULT PO) Take by mouth.   Facility-Administered Encounter Medications as of 10/10/2023  Medication   0.9 %  sodium chloride infusion    Allergies (verified) Patient has no known allergies.   History: Past Medical History:  Diagnosis Date   Abnormal electrocardiogram 10/14/2017   Arthritis    Atypical chest pain 10/14/2017   Cataract    Elevated BP without diagnosis of hypertension 09/16/2017   History of fainting spells of unknown cause    Hyperlipidemia    Past Surgical History:  Procedure Laterality Date   COLONOSCOPY  2010   [place] =normal exam per pt   Family History  Problem Relation Age of Onset   Diabetes Mother    Colon polyps Sister     Diabetes Brother    Diabetes Brother    Diabetes Brother    COPD Father    Heart disease Neg Hx    Colon cancer Neg Hx    Esophageal cancer Neg Hx    Rectal cancer Neg Hx    Stomach cancer Neg Hx    Social History   Socioeconomic History   Marital status: Widowed    Spouse name: Not on file   Number of children: Not on file   Years of education: Not on file   Highest education level: Not on file  Occupational History   Occupation: retired  Tobacco Use   Smoking status: Former    Current packs/day: 0.00    Average packs/day: 0.5 packs/day for 35.0 years (17.5 ttl pk-yrs)    Types: Cigarettes    Start date: 09/23/1987    Quit date: 09/22/2022    Years since quitting: 1.0   Smokeless tobacco: Never   Tobacco comments:    pt states she has all the info  Vaping Use   Vaping status: Never Used  Substance and Sexual Activity   Alcohol use: Not Currently   Drug use: No   Sexual activity: Not Currently  Other Topics Concern   Not on file  Social History Narrative   Not  on file   Social Drivers of Health   Financial Resource Strain: Low Risk  (10/10/2023)   Overall Financial Resource Strain (CARDIA)    Difficulty of Paying Living Expenses: Not hard at all  Food Insecurity: No Food Insecurity (10/10/2023)   Hunger Vital Sign    Worried About Running Out of Food in the Last Year: Never true    Ran Out of Food in the Last Year: Never true  Transportation Needs: No Transportation Needs (10/10/2023)   PRAPARE - Administrator, Civil Service (Medical): No    Lack of Transportation (Non-Medical): No  Physical Activity: Inactive (10/10/2023)   Exercise Vital Sign    Days of Exercise per Week: 0 days    Minutes of Exercise per Session: 0 min  Stress: No Stress Concern Present (10/10/2023)   [employer] of Occupational Health - Occupational Stress Questionnaire    Feeling of Stress : Only a little  Social Connections: Socially Isolated (10/10/2023)   Social  Connection and Isolation Panel [NHANES]    Frequency of Communication with Friends and Family: Once a week    Frequency of Social Gatherings with Friends and Family: Once a week    Attends Religious Services: More than 4 times per year    Active Member of [employer] or Organizations: No    Attends Banker Meetings: Never    Marital Status: Widowed    Tobacco Counseling Counseling given: Not Answered Tobacco comments: pt states she has all the info   Clinical Intake:  Pre-visit preparation completed: Yes  Pain : No/denies pain     Nutritional Status: BMI 25 -29 Overweight Nutritional Risks: None Diabetes: Yes CBG done?: No Did pt. bring in CBG monitor from home?: No  How often do you need to have someone help you when you read instructions, pamphlets, or other written materials from your doctor or pharmacy?: 1 - Never  Interpreter Needed?: No  Information entered by :: NAllen LPN   Activities of Daily Living    10/10/2023    8:42 AM  In your present state of health, do you have any difficulty performing the following activities:  Hearing? 1  Comment slight decrease, hearing aid not needed  Vision? 0  Difficulty concentrating or making decisions? 1  Comment same when was young  Walking or climbing stairs? 0  Dressing or bathing? 0  Doing errands, shopping? 0  Preparing Food and eating ? N  Using the Toilet? N  In the past six months, have you accidently leaked urine? N  Do you have problems with loss of bowel control? N  Managing your Medications? N  Managing your Finances? N  Housekeeping or managing your Housekeeping? N    Patient Care Team: Nche, Bonna Gains, NP as PCP - General (Internal Medicine) End, Cristal Deer, MD as PCP - Cardiology (Cardiology)  Indicate any recent Medical Services you may have received from other than [facility] providers in the past year (date may be approximate).     Assessment:   This is a routine wellness examination  for [facility].  Hearing/Vision screen Hearing Screening - Comments:: Slight decrease, but no hearing aids Vision Screening - Comments:: Regular eye exams, [facility]   Goals Addressed             This Visit's Progress    Patient Stated       10/10/2023, stay healthy       Depression Screen    10/10/2023    8:50 AM 06/13/2023  8:05 AM 02/11/2023    9:12 AM 09/24/2022    8:12 AM 08/12/2022    9:05 AM 08/19/2021    3:57 PM 08/19/2021    3:52 PM  PHQ 2/9 Scores  PHQ - 2 Score 1 0 0 0 0 0 0  PHQ- 9 Score 2          Fall Risk    10/10/2023    8:49 AM 06/13/2023    8:05 AM 02/11/2023    9:12 AM 09/24/2022    8:12 AM 08/12/2022    9:05 AM  Fall Risk   Falls in the past year? 0 0 0 0 0  Number falls in past yr: 0  0 0 0  Injury with Fall? 0  0 0 0  Risk for fall due to : Medication side effect No Fall Risks  No Fall Risks   Follow up Falls prevention discussed;Falls evaluation completed  Falls evaluation completed Falls prevention discussed;Education provided;Falls evaluation completed     MEDICARE RISK AT HOME: Medicare Risk at Home Any stairs in or around the home?: Yes If so, are there any without handrails?: No Home free of loose throw rugs in walkways, pet beds, electrical cords, etc?: Yes Adequate lighting in your home to reduce risk of falls?: Yes Life alert?: No Use of a cane, walker or w/c?: No Grab bars in the bathroom?: No Shower chair or bench in shower?: Yes Elevated toilet seat or a handicapped toilet?: No  TIMED UP AND GO:  Was the test performed?  Yes  Length of time to ambulate 10 feet: 5 sec Gait steady and fast without use of assistive device    Cognitive Function:        10/10/2023    8:54 AM 09/24/2022    8:15 AM 05/20/2020    9:21 AM  6CIT Screen  What Year? 0 points 0 points 0 points  What month? 0 points 0 points 0 points  What time? 0 points 0 points 0 points  Count back from 20 0 points 0 points 0 points  Months in reverse 0 points 0  points 2 points  Repeat phrase 0 points 0 points 2 points  Total Score 0 points 0 points 4 points    Immunizations Immunization History  Administered Date(s) Administered   Fluad Quad(high Dose 65+) 05/11/2019, 06/10/2020, 06/04/2022   Fluad Trivalent(High Dose 65+) 06/13/2023   Influenza, High Dose Seasonal PF 07/05/2018   Influenza-Unspecified 07/05/2021   Janssen (J&J) SARS-COV-2 Vaccination 11/22/2019   PFIZER(Purple Top)SARS-COV-2 Vaccination 11/30/2019, 06/25/2020, 03/13/2021   PNEUMOCOCCAL CONJUGATE-20 06/13/2023   Unspecified SARS-COV-2 Vaccination 06/04/2022   Zoster Recombinant(Shingrix) 05/12/2019, 08/06/2019    TDAP status: Due, Education has been provided regarding the importance of this vaccine. Advised may receive this vaccine at local pharmacy or Health Dept. Aware to provide a copy of the vaccination record if obtained from local pharmacy or Health Dept. Verbalized acceptance and understanding.  Flu Vaccine status: Up to date  Pneumococcal vaccine status: Up to date  Covid-19 vaccine status: Information provided on how to obtain vaccines.   Qualifies for Shingles Vaccine? Yes   Zostavax completed Yes   Shingrix Completed?: Yes  Screening Tests Health Maintenance  Topic Date Due   DTaP/Tdap/Td (1 - Tdap) Never done   Colonoscopy  07/29/2023   Diabetic kidney evaluation - eGFR measurement  02/11/2024   Diabetic kidney evaluation - Urine ACR  02/11/2024   HEMOGLOBIN A1C  03/15/2024   FOOT EXAM  06/12/2024  OPHTHALMOLOGY EXAM  08/31/2024   Medicare Annual Wellness (AWV)  10/09/2024   Pneumonia Vaccine 63+ Years old  Completed   INFLUENZA VACCINE  Completed   DEXA SCAN  Completed   Hepatitis C Screening  Completed   Zoster Vaccines- Shingrix  Completed   HPV VACCINES  Aged Out   COVID-19 Vaccine  Discontinued    Health Maintenance  Health Maintenance Due  Topic Date Due   DTaP/Tdap/Td (1 - Tdap) Never done   Colonoscopy  07/29/2023    Colorectal  cancer screening: has a consultation coming up   Mammogram status: Completed 09/12/2023. Repeat every year  Bone Density status: Completed 06/09/2020.   Lung Cancer Screening: (Low Dose CT Chest recommended if Age 59-80 years, 20 pack-year currently smoking OR have quit w/in 15years.) does not qualify.   Lung Cancer Screening Referral: no  Additional Screening:  Hepatitis C Screening: does qualify; Completed 03/13/2021  Vision Screening: Recommended annual ophthalmology exams for early detection of glaucoma and other disorders of the eye. Is the patient up to date with their annual eye exam?  Yes  Who is the provider or what is the name of the office in which the patient attends annual eye exams? [facility] If pt is not established with a provider, would they like to be referred to a provider to establish care? No .   Dental Screening: Recommended annual dental exams for proper oral hygiene  Diabetic Foot Exam: Diabetic Foot Exam: Completed 06/13/2023  Community Resource Referral / Chronic Care Management: CRR required this visit?  No   CCM required this visit?  No     Plan:     I have personally reviewed and noted the following in the patient's chart:   Medical and social history Use of alcohol, tobacco or illicit drugs  Current medications and supplements including opioid prescriptions. Patient is not currently taking opioid prescriptions. Functional ability and status Nutritional status Physical activity Advanced directives List of other physicians Hospitalizations, surgeries, and ER visits in previous 12 months Vitals Screenings to include cognitive, depression, and falls Referrals and appointments  In addition, I have reviewed and discussed with patient certain preventive protocols, quality metrics, and best practice recommendations. A written personalized care plan for preventive services as well as general preventive health recommendations were provided to patient.      Barb Merino, LPN   [id number]/[id number]   After Visit Summary: (In Person-Printed) AVS printed and given to the patient  Nurse Notes: none

## 2023-10-24 IMAGING — MG MM DIGITAL SCREENING BILAT W/ TOMO AND CAD
8 series · 8 of 24 positions shown · non-contrast
Comparison: Previous exam(s).

CLINICAL DATA: Screening.

EXAM:
DIGITAL SCREENING BILATERAL MAMMOGRAM WITH TOMOSYNTHESIS AND CAD
TECHNIQUE: Bilateral screening digital craniocaudal and mediolateral oblique
mammograms were obtained. Bilateral screening digital breast
tomosynthesis was performed. The images were evaluated with
computer-aided detection.

[L MLO synth-2D]
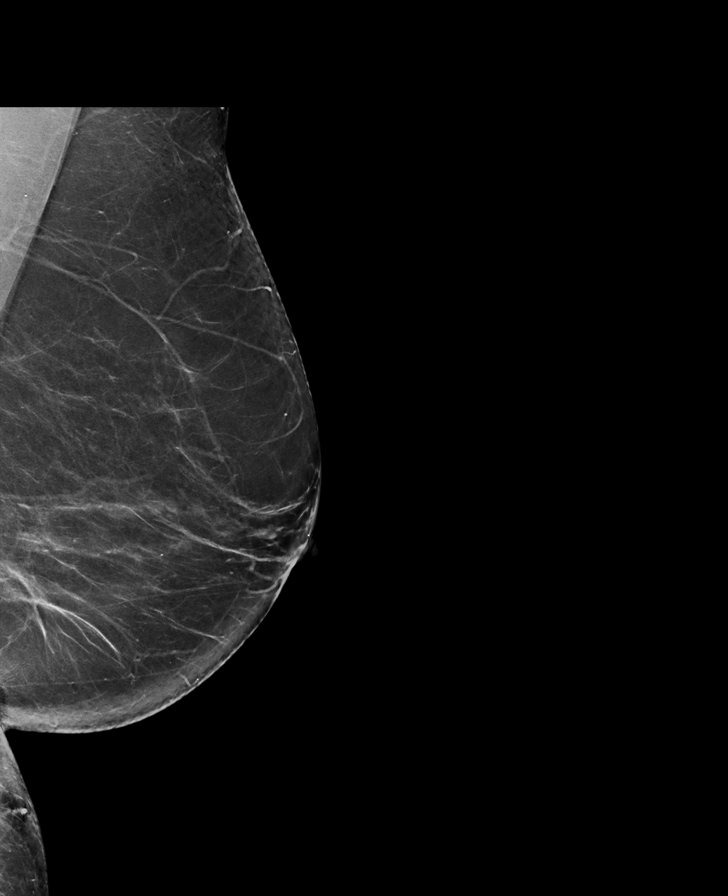

[R CC synth-2D]
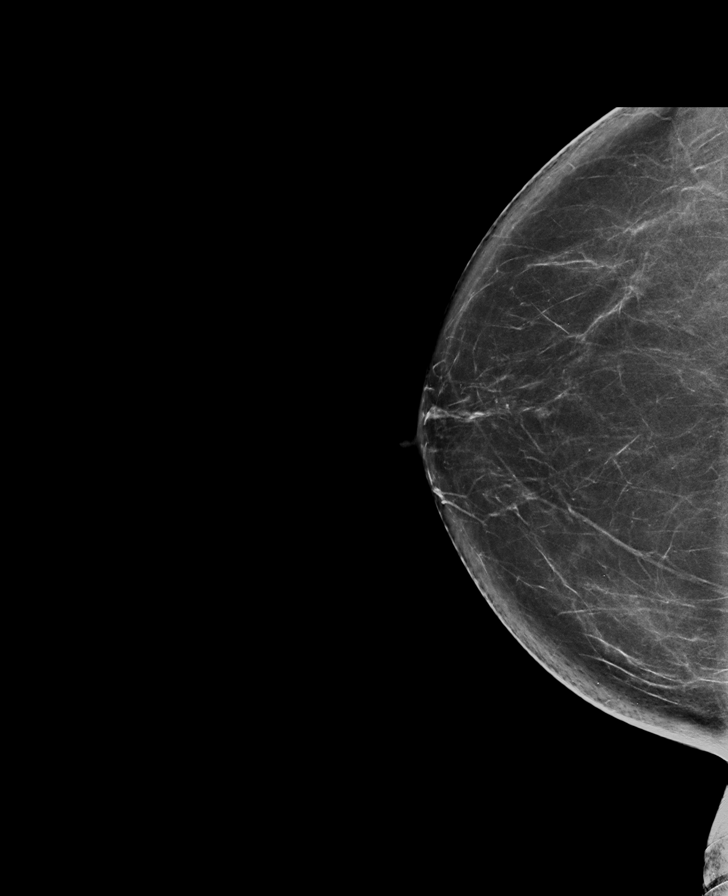

[L CC synth-2D]
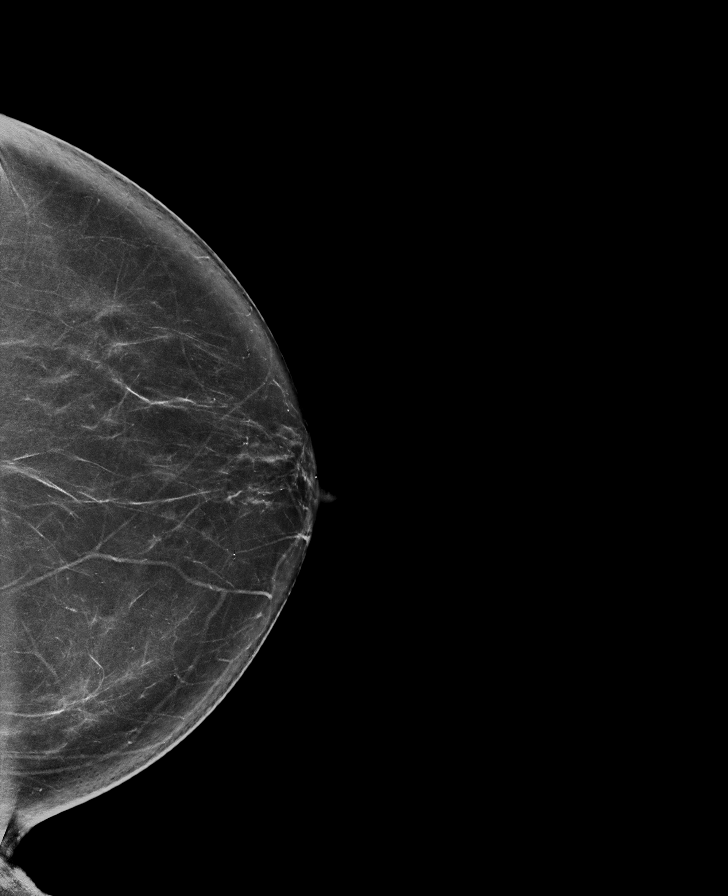

[R MLO synth-2D]
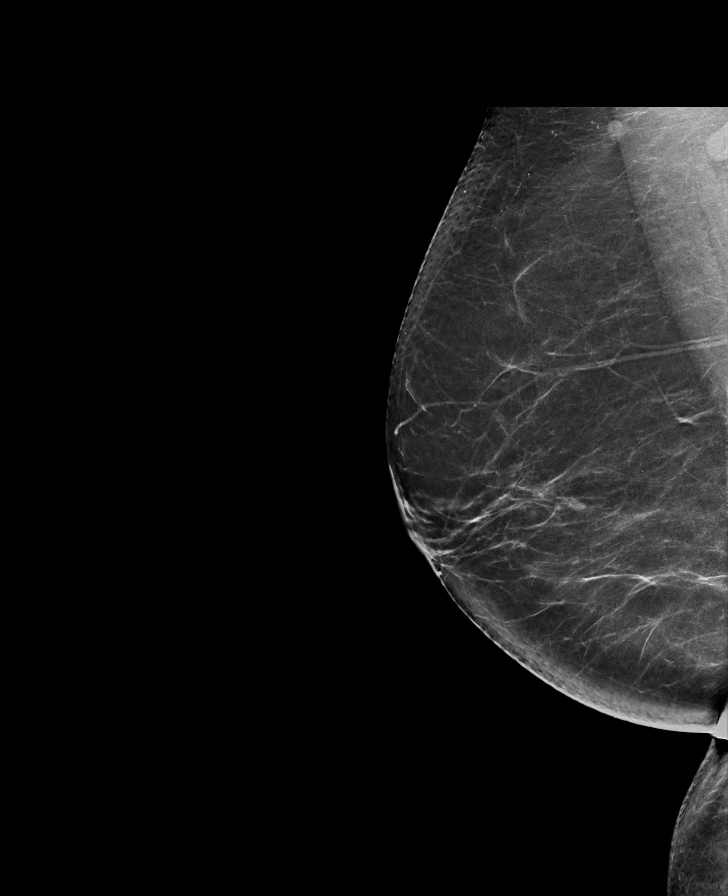

[R MLO tomo · tomo slice 40/79.0]
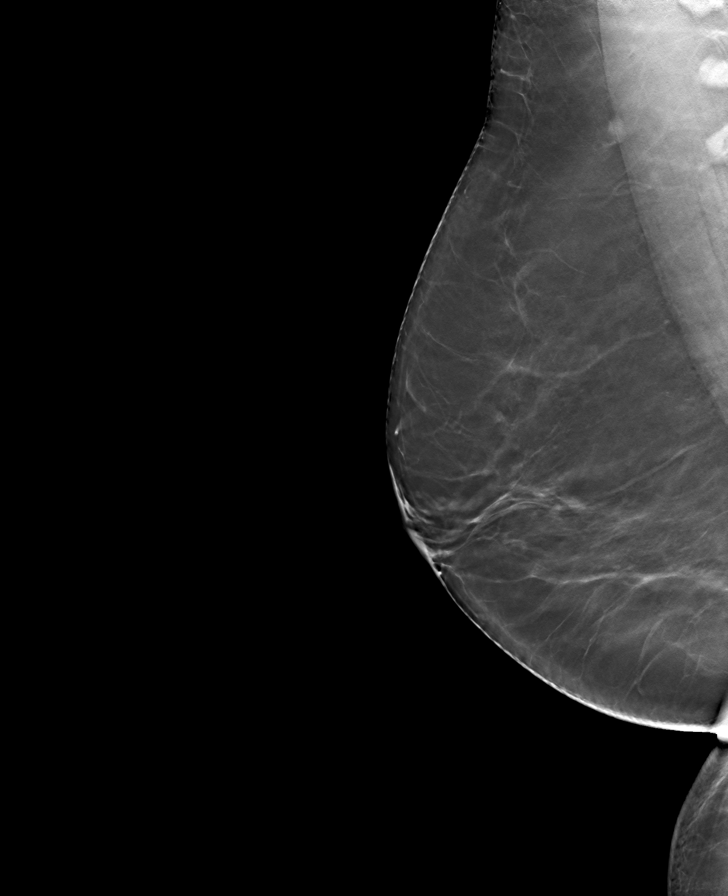

[R CC tomo · tomo slice 39/78.0]
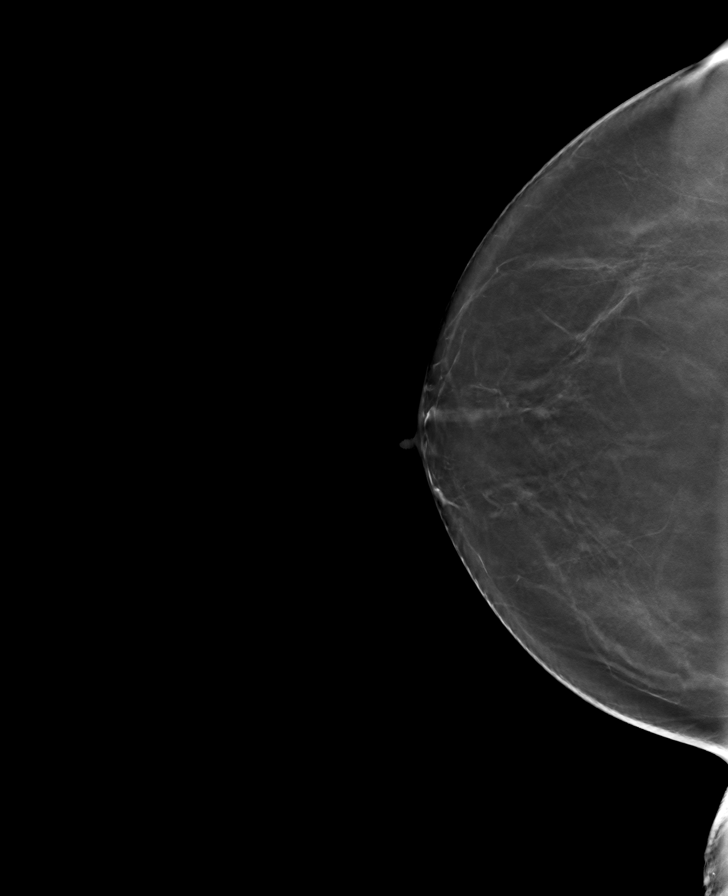

[L CC tomo · tomo slice 39/77.0]
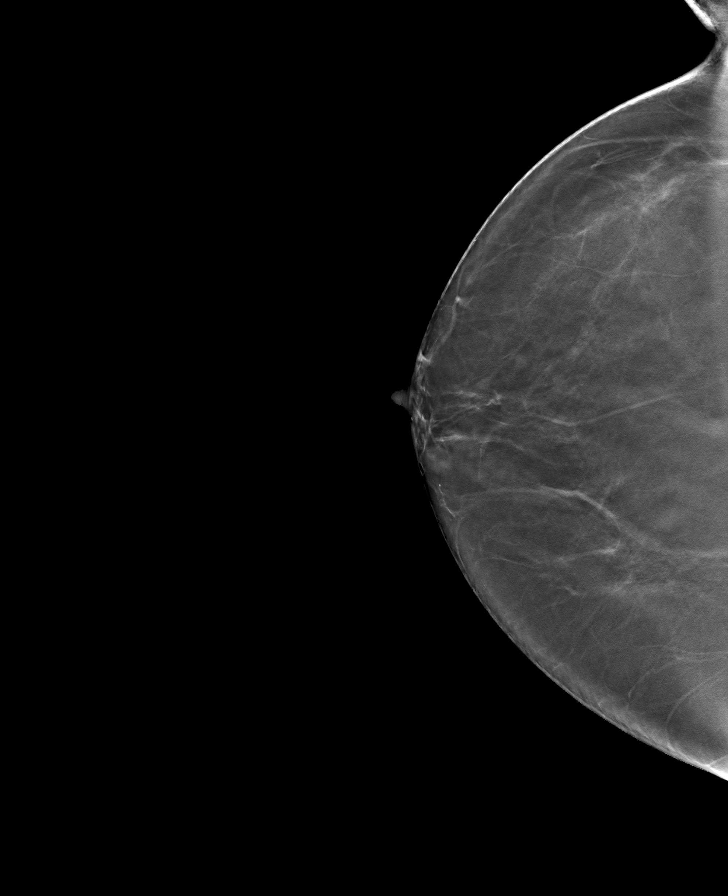

[L MLO tomo · tomo slice 39/76.0]
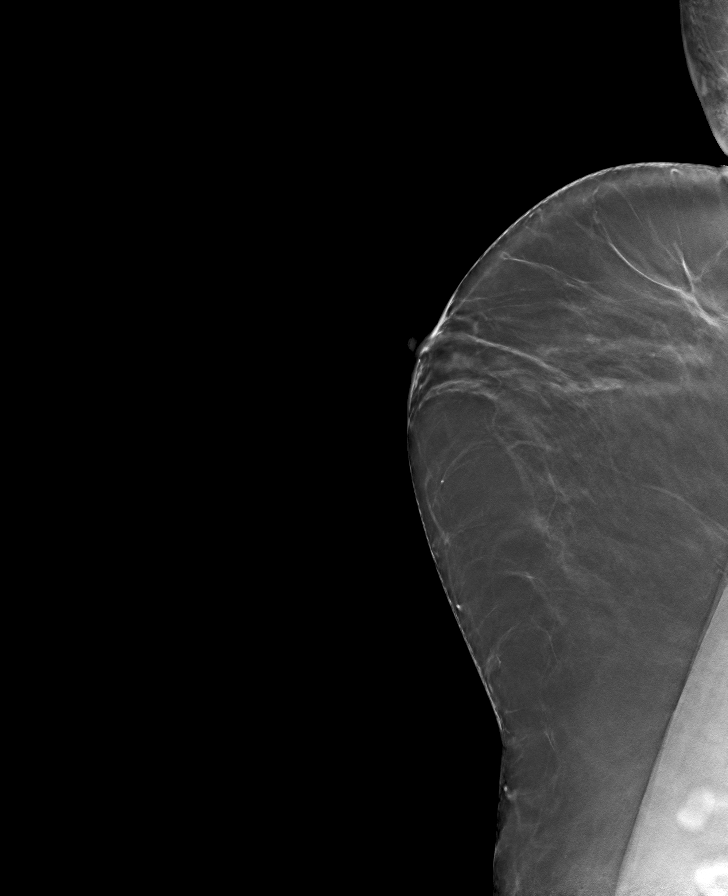

[8 of 24 positions shown; findings below may reference images not displayed]

ACR Breast Density Category b: There are scattered areas of
fibroglandular density.
FINDINGS: There are no findings suspicious for malignancy.
IMPRESSION: No mammographic evidence of malignancy. A result letter of this
screening mammogram will be mailed directly to the patient.

RECOMMENDATION:
Screening mammogram in one year. (Code:51-O-LD2)

BI-RADS CATEGORY  1: Negative.

## 2023-11-02 NOTE — Progress Notes (Unsigned)
 Chief Complaint:discuss recall colonoscopy Primary GI Doctor:Dr. Chales Abrahams  HPI:  Patient is a 78 year old African American female patient with past medical history of arthritis, hyperlipidemia, cataracts, who presents to discuss recall colonoscopy.  Interval History     Patient presents to discuss scheduling colonoscopy. Patient denies GERD or dysphagia. Patient denies nausea, vomiting, or weight loss. Patient denies altered bowel habits, abdominal pain, rectal bleeding. No blood thinners. No GI complaints at this time. No significant family history.   Wt Readings from Last 3 Encounters:  11/03/23 170 lb 12.8 oz (77.5 kg)  10/10/23 170 lb 3.2 oz (77.2 kg)  09/16/23 170 lb 6.4 oz (77.3 kg)    Past Medical History:  Diagnosis Date   Abnormal electrocardiogram 10/14/2017   Arthritis    Atypical chest pain 10/14/2017   Cataract    Elevated BP without diagnosis of hypertension 09/16/2017   History of fainting spells of unknown cause    Hyperlipidemia     Past Surgical History:  Procedure Laterality Date   COLONOSCOPY  2010   [place] =normal exam per pt    Current Outpatient Medications  Medication Sig Dispense Refill   atorvastatin (LIPITOR) 40 MG tablet Take 1 tablet (40 mg total) by mouth daily at 6 PM. 90 tablet 3   meloxicam (MOBIC) 15 MG tablet Take 1 tablet (15 mg total) by mouth daily. 30 tablet 3   Multiple Vitamin (MULTIVITAMIN ADULT PO) Take by mouth.     Current Facility-Administered Medications  Medication Dose Route Frequency Provider Last Rate Last Admin   0.9 %  sodium chloride infusion  500 mL Intravenous Once Lynann Bologna, MD        Allergies as of 11/03/2023   (No Known Allergies)    Family History  Problem Relation Age of Onset   Diabetes Mother    Colon polyps Sister    Diabetes Brother    Diabetes Brother    Diabetes Brother    COPD Father    Heart disease Neg Hx    Colon cancer Neg Hx    Esophageal cancer Neg Hx    Rectal cancer Neg Hx     Stomach cancer Neg Hx     Review of Systems:    Constitutional: No weight loss, fever, chills, weakness or fatigue HEENT: Eyes: No change in vision               Ears, Nose, Throat:  No change in hearing or congestion Skin: No rash or itching Cardiovascular: No chest pain, chest pressure or palpitations   Respiratory: No SOB or cough Gastrointestinal: See HPI and otherwise negative Genitourinary: No dysuria or change in urinary frequency Neurological: No headache, dizziness or syncope Musculoskeletal: No new muscle or joint pain Hematologic: No bleeding or bruising Psychiatric: No history of depression or anxiety    Physical Exam:  Vital signs: BP 130/70   Ht 5\' 3"  (1.6 m)   Wt 170 lb 12.8 oz (77.5 kg)   BMI 30.26 kg/m   Constitutional:   Pleasant African American female appears to be in NAD, Well developed, Well nourished, alert and cooperative Throat: Oral cavity and pharynx without inflammation, swelling or lesion.  Respiratory: Respirations even and unlabored. Lungs clear to auscultation bilaterally.   No wheezes, crackles, or rhonchi.  Cardiovascular: Normal S1, S2. Regular rate and rhythm. No peripheral edema, cyanosis or pallor.  Gastrointestinal:  Soft, nondistended, nontender. No rebound or guarding. Normal bowel sounds. No appreciable masses or hepatomegaly. Rectal:  Not  performed.  Msk:  Symmetrical without gross deformities. Without edema, no deformity or joint abnormality.  Neurologic:  Alert and  oriented x4;  grossly normal neurologically.  Skin:   Dry and intact without significant lesions or rashes. Psychiatric: Oriented to person, place and time. Demonstrates good judgement and reason without abnormal affect or behaviors.  RELEVANT LABS AND IMAGING: CBC    Latest Ref Rng & Units 12/17/2021   12:12 PM 03/13/2021    8:35 AM 05/11/2019    2:32 PM  CBC  WBC 4.0 - 10.5 K/uL 6.1  5.9  6.3   Hemoglobin 12.0 - 15.0 g/dL 16.1  09.6  04.5   Hematocrit 36.0 - 46.0 %  42.2  42.2  41.1   Platelets 150.0 - 400.0 K/uL 170.0  177.0  215      CMP     Latest Ref Rng & Units 02/11/2023   10:02 AM 05/14/2022    9:07 AM 12/17/2021   12:12 PM  CMP  Glucose 70 - 99 mg/dL 73  94  64   BUN 6 - 23 mg/dL 15  15  13    Creatinine 0.40 - 1.20 mg/dL 4.09  8.11  9.14   Sodium 135 - 145 mEq/L 141  141  141   Potassium 3.5 - 5.1 mEq/L 4.0  4.0  3.5   Chloride 96 - 112 mEq/L 104  105  106   CO2 19 - 32 mEq/L 28  26  26    Calcium 8.4 - 10.5 mg/dL 9.1  9.4  9.0   Total Protein 6.0 - 8.3 g/dL  7.0    Total Bilirubin 0.2 - 1.2 mg/dL  0.7    Alkaline Phos 39 - 117 U/L  75    AST 0 - 37 U/L  21    ALT 0 - 35 U/L  18       Lab Results  Component Value Date   TSH 1.78 12/17/2021  07/28/2020 colonoscopy with Dr. Chales Abrahams, recall 3 years Impression:  - Colonic polyps s/ p polypectomy.  - Mild sigmoid diverticulosis. - Highly redundant colon.  - Otherwise normal colonoscopy to TI. Path:  Diagnosis 1. Surgical [P], colon, descending, polyp (1) - HYPERPLASTIC POLYP. 2. Surgical [P], colon, sigmoid, polyp (1) - HYPERPLASTIC POLYP. 3. Surgical [P], colon, distal sigmoid, proximal rectum, polyp (1) - TUBULAR ADENOMA. - NEGATIVE FOR HIGH GRADE DYSPLASIA. 4. Surgical [P], colon, recto-sigmoid, polyp (1) - TUBULAR ADENOMA. - NEGATIVE FOR HIGH GRADE DYSPLASIA. 10/25/2017 echo- LV EF: 55% -   60%   Assessment: Encounter Diagnosis  Name Primary?   History of colonic polyps Yes   78 year old African [place] female patient that presents for recall colonoscopy for history of colonic polyps, will schedule in [facility] with Dr. Chales Abrahams. She has no GI concerns at this time.   Plan: -Schedule a colonoscopy in [facility] with Dr. Chales Abrahams. The risks and benefits of colonoscopy with possible polypectomy / biopsies were discussed and the patient agrees to proceed.    Thank you for the courtesy of this consult. Please call me with any questions or concerns.   Fredrich Cory, FNP-C [facility] 11/03/2023, 8:56 AM  Cc: Anne Ng, NP

## 2023-11-03 ENCOUNTER — Ambulatory Visit (INDEPENDENT_AMBULATORY_CARE_PROVIDER_SITE_OTHER): Payer: Medicare Other | Admitting: Gastroenterology

## 2023-11-03 ENCOUNTER — Encounter: Payer: Self-pay | Admitting: Gastroenterology

## 2023-11-03 VITALS — BP 130/70 | Ht 63.0 in | Wt 170.8 lb

## 2023-11-03 DIAGNOSIS — Z860100 Personal history of colon polyps, unspecified: Secondary | ICD-10-CM

## 2023-11-03 DIAGNOSIS — Z09 Encounter for follow-up examination after completed treatment for conditions other than malignant neoplasm: Secondary | ICD-10-CM

## 2023-11-03 MED ORDER — SUFLAVE 178.7 G PO SOLR: 1.0000 | Freq: Once | ORAL | 0 refills | Status: AC

## 2023-11-03 NOTE — Progress Notes (Signed)
 Agree with assessment/plan.  Edman Circle, MD [facility] [phone]

## 2023-11-03 NOTE — Patient Instructions (Signed)
 You have been scheduled for a colonoscopy. Please follow written instructions given to you at your visit today.   If you use inhalers (even only as needed), please bring them with you on the day of your procedure.  DO NOT TAKE 7 DAYS PRIOR TO TEST- Trulicity (dulaglutide) Ozempic, Wegovy (semaglutide) Mounjaro (tirzepatide) Bydureon Bcise (exanatide extended release)  DO NOT TAKE 1 DAY PRIOR TO YOUR TEST Rybelsus (semaglutide) Adlyxin (lixisenatide) Victoza (liraglutide) Byetta (exanatide) ___________________________________________________________________________  Heather Winters will receive your bowel preparation through [facility], which ensures the lowest copay and home delivery, with outreach via text or call from an [phone] number. Please respond promptly to avoid rescheduling of your procedure. If you are interested in alternative options or have any questions regarding your prep, please contact them at [phone] ____________________________________________________________________________  Your Provider Has Sent Your Bowel Prep Regimen To [facility]   [facility] will contact you to verify your information and collect your copay, if applicable. Enjoy the comfort of your home while your prescription is mailed to you, FREE of any shipping charges.   [facility] accepts all major insurance benefits and applies discounts & coupons.  Have additional questions?   Chat: [email or web address] Call: [phone] Email: [email or web address] NCPDP: [id number]  How will [facility] contact you?  With a Welcome phone call,  a Welcome text and a checkout link in text form.  Texts you receive from [phone] Are NOT Spam.  *To set up delivery, you must complete the checkout process via link or speak to one of the patient care representatives. If [facility] is unable to reach you, your prescription Winters be delayed.  To avoid long hold times on the phone, you Winters also utilize the secure chat  feature on the [facility] website to request that they call you back for transaction completion or to expedite your concerns.  Due to recent changes in healthcare laws, you Winters see the results of your imaging and laboratory studies on MyChart before your provider has had a chance to review them.  We understand that in some cases there Winters be results that are confusing or concerning to you. Not all laboratory results come back in the same time frame and the provider Winters be waiting for multiple results in order to interpret others.  Please give [place] 48 hours in order for your provider to thoroughly review all the results before contacting the office for clarification of your results.   _______________________________________________________  If your blood pressure at your visit was 140/90 or greater, please contact your primary care physician to follow up on this.  _______________________________________________________  If you are age 22 or older, your body mass index should be between 23-30. Your Body mass index is 30.26 kg/m. If this is out of the aforementioned range listed, please consider follow up with your Primary Care Provider.  If you are age 32 or younger, your body mass index should be between 19-25. Your Body mass index is 30.26 kg/m. If this is out of the aformentioned range listed, please consider follow up with your Primary Care Provider.   ________________________________________________________  The [facility] providers would like to encourage you to use [facility] to communicate with providers for non-urgent requests or questions.  Due to long hold times on the telephone, sending your provider a message by [facility] Winters be a faster and more efficient way to get a response.  Please allow 48 business hours for a response.  Please remember that this is for non-urgent requests.  _______________________________________________________ Thank you for trusting me  with your gastrointestinal  care!   Heather Gouge May, NP

## 2023-12-29 ENCOUNTER — Encounter: Payer: Self-pay | Admitting: Gastroenterology

## 2023-12-29 ENCOUNTER — Ambulatory Visit: Admitting: Gastroenterology

## 2023-12-29 VITALS — BP 116/70 | HR 71 | Temp 97.8°F | Resp 13 | Ht 63.0 in | Wt 170.0 lb

## 2023-12-29 DIAGNOSIS — Z860100 Personal history of colon polyps, unspecified: Secondary | ICD-10-CM

## 2023-12-29 DIAGNOSIS — D125 Benign neoplasm of sigmoid colon: Secondary | ICD-10-CM

## 2023-12-29 DIAGNOSIS — K635 Polyp of colon: Secondary | ICD-10-CM

## 2023-12-29 DIAGNOSIS — Z1211 Encounter for screening for malignant neoplasm of colon: Secondary | ICD-10-CM | POA: Diagnosis not present

## 2023-12-29 DIAGNOSIS — K640 First degree hemorrhoids: Secondary | ICD-10-CM

## 2023-12-29 DIAGNOSIS — K621 Rectal polyp: Secondary | ICD-10-CM | POA: Diagnosis not present

## 2023-12-29 DIAGNOSIS — K5730 Diverticulosis of large intestine without perforation or abscess without bleeding: Secondary | ICD-10-CM

## 2023-12-29 MED ORDER — SODIUM CHLORIDE 0.9 % IV SOLN
500.0000 mL | INTRAVENOUS | Status: DC
Start: 2023-12-29 — End: 2023-12-29

## 2023-12-29 NOTE — Op Note (Signed)
 [facility] Patient Name: Heather Winters Procedure Date: 12/29/2023 11:24 AM MRN: [id number] Endoscopist: Lajuan Pila , MD, [id number] Age: 78 Referring MD:  Date of Birth: 1946-02-03 Gender: Female Account #: [id number] Procedure:                Colonoscopy Indications:              High risk colon cancer surveillance: Personal                            history of colonic polyps Medicines:                Monitored Anesthesia Care Procedure:                Pre-Anesthesia Assessment:                           - Prior to the procedure, a History and Physical                            was performed, and patient medications and                            allergies were reviewed. The patient's tolerance of                            previous anesthesia was also reviewed. The risks                            and benefits of the procedure and the sedation                            options and risks were discussed with the patient.                            All questions were answered, and informed consent                            was obtained. Prior Anticoagulants: The patient has                            taken no anticoagulant or antiplatelet agents. ASA                            Grade Assessment: II - A patient with mild systemic                            disease. After reviewing the risks and benefits,                            the patient was deemed in satisfactory condition to                            undergo the procedure.  After obtaining informed consent, the colonoscope                            was passed under direct vision. Throughout the                            procedure, the patient's blood pressure, pulse, and                            oxygen saturations were monitored continuously. The                            Olympus Scope SN [id number] was introduced through the                            anus and advanced to the the cecum,  identified by                            appendiceal orifice and ileocecal valve. The                            colonoscopy was performed without difficulty. The                            patient tolerated the procedure well. The quality                            of the bowel preparation was good. The ileocecal                            valve, appendiceal orifice, and rectum were                            photographed. Scope In: 11:30:42 AM Scope Out: 11:49:45 AM Scope Withdrawal Time: 0 hours 13 minutes 42 seconds  Total Procedure Duration: 0 hours 19 minutes 3 seconds  Findings:                 Two sessile polyps were found in the distal sigmoid                            colon. The polyps were 3 to 4 mm in size. These                            polyps were removed with a cold snare. Resection                            and retrieval were complete. Several small polyps                            measuring 2 to 3 mm were visualized in the distal  sigmoid colon and in the proximal rectum. Deemed to                            be hyperplastic on NBI. Hence not removed.                           A few small-mouthed diverticula were found in the                            sigmoid colon.                           Non-bleeding internal hemorrhoids were found during                            retroflexion. The hemorrhoids were small and Grade                            I (internal hemorrhoids that do not prolapse).                           The exam was otherwise without abnormality on                            direct and retroflexion views. Complications:            No immediate complications. Estimated Blood Loss:     Estimated blood loss: none. Impression:               - Two 3 to 4 mm polyps in the distal sigmoid colon,                            removed with a cold snare. Resected and retrieved.                           - Mild sigmoid diverticulosis.                            - Non-bleeding internal hemorrhoids.                           - The examination was otherwise normal on direct                            and retroflexion views. Recommendation:           - Patient has a contact number available for                            emergencies. The signs and symptoms of potential                            delayed complications were discussed with the                            patient. Return to normal  activities tomorrow.                            Written discharge instructions were provided to the                            patient.                           - Resume previous diet.                           - Continue present medications.                           - Await pathology results.                           - Repeat colonoscopy is not recommended for                            surveillance. Hence, repeat colonoscopy only if                            with any new problems.                           - Return to GI clinic PRN. Lajuan Pila, MD 12/29/2023 11:55:43 AM This report has been signed electronically.

## 2023-12-29 NOTE — Progress Notes (Signed)
 Vss nad trans to pacu

## 2023-12-29 NOTE — Progress Notes (Signed)
 [facility] History and Physical   Primary Care Physician:  Kandace Organ, NP   Reason for Procedure:    history of polyps 07/2020  Plan:    colon     HPI: Heather Winters is a 78 y.o. female    Past Medical History:  Diagnosis Date   Abnormal electrocardiogram 10/14/2017   Arthritis    Atypical chest pain 10/14/2017   Cataract    Elevated BP without diagnosis of hypertension 09/16/2017   History of fainting spells of unknown cause    Hyperlipidemia     Past Surgical History:  Procedure Laterality Date   COLONOSCOPY  2010   [place]  =normal exam per pt    Prior to Admission medications   Medication Sig Start Date End Date Taking? Authorizing Provider  atorvastatin  (LIPITOR) 40 MG tablet Take 1 tablet (40 mg total) by mouth daily at 6 PM. 09/16/23   Nche, Connye Delaine, NP  meloxicam  (MOBIC ) 15 MG tablet Take 1 tablet (15 mg total) by mouth daily. 06/28/23   Hyatt, Max T, DPM  Multiple Vitamin (MULTIVITAMIN ADULT PO) Take by mouth.    [provider]    Current Outpatient Medications  Medication Sig Dispense Refill   atorvastatin  (LIPITOR) 40 MG tablet Take 1 tablet (40 mg total) by mouth daily at 6 PM. 90 tablet 3   meloxicam  (MOBIC ) 15 MG tablet Take 1 tablet (15 mg total) by mouth daily. 30 tablet 3   Multiple Vitamin (MULTIVITAMIN ADULT PO) Take by mouth.     Current Facility-Administered Medications  Medication Dose Route Frequency Provider Last Rate Last Admin   0.9 %  sodium chloride  infusion  500 mL Intravenous Once Lajuan Pila, MD       0.9 %  sodium chloride  infusion  500 mL Intravenous Continuous Lajuan Pila, MD        Allergies as of 12/29/2023   (No Known Allergies)    Family History  Problem Relation Age of Onset   Diabetes Mother    Colon polyps Sister    Diabetes Brother    Diabetes Brother    Diabetes Brother    COPD Father    Heart disease Neg Hx    Colon cancer Neg Hx    Esophageal cancer Neg Hx    Rectal  cancer Neg Hx    Stomach cancer Neg Hx     Social History   Socioeconomic History   Marital status: Widowed    Spouse name: Not on file   Number of children: Not on file   Years of education: Not on file   Highest education level: Not on file  Occupational History   Occupation: retired  Tobacco Use   Smoking status: Former    Current packs/day: 0.00    Average packs/day: 0.5 packs/day for 35.0 years (17.5 ttl pk-yrs)    Types: Cigarettes    Start date: 09/23/1987    Quit date: 09/22/2022    Years since quitting: 1.2   Smokeless tobacco: Never   Tobacco comments:    pt states she has all the info  Vaping Use   Vaping status: Never Used  Substance and Sexual Activity   Alcohol use: Not Currently   Drug use: No   Sexual activity: Not Currently  Other Topics Concern   Not on file  Social History Narrative   Not on file   Social Drivers of Health   Financial Resource Strain: Low Risk  (10/10/2023)   Overall Financial Resource  Strain (CARDIA)    Difficulty of Paying Living Expenses: Not hard at all  Food Insecurity: No Food Insecurity (10/10/2023)   Hunger Vital Sign    Worried About Running Out of Food in the Last Year: Never true    Ran Out of Food in the Last Year: Never true  Transportation Needs: No Transportation Needs (10/10/2023)   PRAPARE - Administrator, Civil Service (Medical): No    Lack of Transportation (Non-Medical): No  Physical Activity: Inactive (10/10/2023)   Exercise Vital Sign    Days of Exercise per Week: 0 days    Minutes of Exercise per Session: 0 min  Stress: No Stress Concern Present (10/10/2023)   [employer] of Occupational Health - Occupational Stress Questionnaire    Feeling of Stress : Only a little  Social Connections: Socially Isolated (10/10/2023)   Social Connection and Isolation Panel [NHANES]    Frequency of Communication with Friends and Family: Once a week    Frequency of Social Gatherings with Friends and Family:  Once a week    Attends Religious Services: More than 4 times per year    Active Member of [employer] or Organizations: No    Attends Banker Meetings: Never    Marital Status: Widowed  Intimate Partner Violence: Not At Risk (10/10/2023)   Humiliation, Afraid, Rape, and Kick questionnaire    Fear of Current or Ex-Partner: No    Emotionally Abused: No    Physically Abused: No    Sexually Abused: No    Review of Systems: Positive for none All other review of systems negative except as mentioned in the HPI.  Physical Exam: Vital signs in last 24 hours: @VSRANGES @   General:   Alert,  Well-developed, well-nourished, pleasant and cooperative in NAD Lungs:  Clear throughout to auscultation.   Heart:  Regular rate and rhythm; no murmurs, clicks, rubs,  or gallops. Abdomen:  Soft, nontender and nondistended. Normal bowel sounds.   Neuro/Psych:  Alert and cooperative. Normal mood and affect. A and O x 3    No significant changes were identified.  The patient continues to be an appropriate candidate for the planned procedure and anesthesia.   Magnus Schuller, MD. [facility] 12/29/2023 11:23 AM@

## 2023-12-29 NOTE — Progress Notes (Signed)
 Called to room to assist during endoscopic procedure.  Patient ID and intended procedure confirmed with present staff. Received instructions for my participation in the procedure from the performing physician.

## 2023-12-29 NOTE — Progress Notes (Signed)
 Pt's states no medical or surgical changes since previsit or office visit.

## 2023-12-29 NOTE — Patient Instructions (Signed)
-  Handout on polyps, diverticulosis and hemorrhoids provided -await pathology results -repeat colonoscopy for surveillance only recommended if you have any new problems -Return to GI clinic as needed  -Continue present medications    YOU HAD AN ENDOSCOPIC PROCEDURE TODAY AT THE [facility]:   Refer to the procedure report that was given to you for any specific questions about what was found during the examination.  If the procedure report does not answer your questions, please call your gastroenterologist to clarify.  If you requested that your care partner not be given the details of your procedure findings, then the procedure report has been included in a sealed envelope for you to review at your convenience later.  YOU SHOULD EXPECT: Some feelings of bloating in the abdomen. Passage of more gas than usual.  Walking can help get rid of the air that was put into your GI tract during the procedure and reduce the bloating. If you had a lower endoscopy (such as a colonoscopy or flexible sigmoidoscopy) you may notice spotting of blood in your stool or on the toilet paper. If you underwent a bowel prep for your procedure, you may not have a normal bowel movement for a few days.  Please Note:  You might notice some irritation and congestion in your nose or some drainage.  This is from the oxygen used during your procedure.  There is no need for concern and it should clear up in a day or so.  SYMPTOMS TO REPORT IMMEDIATELY:  Following lower endoscopy (colonoscopy or flexible sigmoidoscopy):  Excessive amounts of blood in the stool  Significant tenderness or worsening of abdominal pains  Swelling of the abdomen that is new, acute  Fever of 100F or higher  For urgent or emergent issues, a gastroenterologist can be reached at any hour by calling [phone]. Do not use MyChart messaging for urgent concerns.    DIET:  We do recommend a small meal at first, but then you may proceed to  your regular diet.  Drink plenty of fluids but you should avoid alcoholic beverages for 24 hours.  ACTIVITY:  You should plan to take it easy for the rest of today and you should NOT DRIVE or use heavy machinery until tomorrow (because of the sedation medicines used during the test).    FOLLOW UP: Our staff will call the number listed on your records the next business day following your procedure.  We will call around 7:15- 8:00 am to check on you and address any questions or concerns that you may have regarding the information given to you following your procedure. If we do not reach you, we will leave a message.     If any biopsies were taken you will be contacted by phone or by letter within the next 1-3 weeks.  Please call us  at [phone] if you have not heard about the biopsies in 3 weeks.    SIGNATURES/CONFIDENTIALITY: You and/or your care partner have signed paperwork which will be entered into your electronic medical record.  These signatures attest to the fact that that the information above on your After Visit Summary has been reviewed and is understood.  Full responsibility of the confidentiality of this discharge information lies with you and/or your care-partner.

## 2023-12-30 ENCOUNTER — Telehealth: Payer: Self-pay

## 2023-12-30 NOTE — Telephone Encounter (Signed)
 Follow up call to pt, no answer.

## 2024-01-02 LAB — SURGICAL PATHOLOGY

## 2024-01-09 ENCOUNTER — Ambulatory Visit: Payer: Self-pay | Admitting: Gastroenterology

## 2024-03-15 ENCOUNTER — Encounter: Payer: Self-pay | Admitting: Nurse Practitioner

## 2024-03-15 ENCOUNTER — Ambulatory Visit: Payer: Medicare Other | Admitting: Nurse Practitioner

## 2024-03-15 VITALS — BP 132/78 | HR 64 | Temp 98.2°F | Ht 63.0 in | Wt 170.4 lb

## 2024-03-15 DIAGNOSIS — E1169 Type 2 diabetes mellitus with other specified complication: Secondary | ICD-10-CM | POA: Diagnosis not present

## 2024-03-15 DIAGNOSIS — G8929 Other chronic pain: Secondary | ICD-10-CM | POA: Diagnosis not present

## 2024-03-15 DIAGNOSIS — E785 Hyperlipidemia, unspecified: Secondary | ICD-10-CM

## 2024-03-15 DIAGNOSIS — M25562 Pain in left knee: Secondary | ICD-10-CM | POA: Diagnosis not present

## 2024-03-15 DIAGNOSIS — E1165 Type 2 diabetes mellitus with hyperglycemia: Secondary | ICD-10-CM

## 2024-03-15 LAB — MICROALBUMIN / CREATININE URINE RATIO
Creatinine,U: 19.6 mg/dL
Microalb Creat Ratio: 42.9 mg/g — ABNORMAL HIGH (ref 0.0–30.0)
Microalb, Ur: 0.8 mg/dL (ref 0.0–1.9)

## 2024-03-15 LAB — RENAL FUNCTION PANEL
Albumin: 4.3 g/dL (ref 3.5–5.2)
BUN: 14 mg/dL (ref 6–23)
CO2: 28 meq/L (ref 19–32)
Calcium: 9.2 mg/dL (ref 8.4–10.5)
Chloride: 104 meq/L (ref 96–112)
Creatinine, Ser: 0.69 mg/dL (ref 0.40–1.20)
GFR: 82.91 mL/min (ref >60.00)
Glucose, Bld: 91 mg/dL (ref 70–99)
Phosphorus: 3.3 mg/dL (ref 2.3–4.6)
Potassium: 3.5 meq/L (ref 3.5–5.1)
Sodium: 139 meq/L (ref 135–145)

## 2024-03-15 LAB — HEMOGLOBIN A1C: Hgb A1c MFr Bld: 5.9 % (ref 4.6–6.5)

## 2024-03-15 LAB — URIC ACID: Uric Acid, Serum: 5.4 mg/dL (ref 2.4–7.0)

## 2024-03-15 MED ORDER — NAPROXEN 500 MG PO TABS: 500.0000 mg | ORAL_TABLET | Freq: Two times a day (BID) | ORAL | 2 refills | Status: DC | PRN

## 2024-03-15 NOTE — Progress Notes (Signed)
 Established Patient Visit  Patient: Heather Winters   DOB: [birth year]   78 y.o. Female  MRN: [id number] Visit Date: 03/15/2024  Subjective:    Chief Complaint  Patient presents with   Follow-up    (FASTING) 6 month follow up for DM, Hyperlipidemia  Intermittent Left knee pain    HPI Hyperlipidemia associated with type 2 diabetes mellitus (HCC) No adverse effects with atorvastatin  Maintain med dose  Type 2 diabetes mellitus with hyperglycemia, without long-term current use of insulin (HCC) Controlled with diet UTD with eye exam Report bilateral numbness in 5th toe, no ulcer, no swelling, no pain with ambulation or elevation, normal pedal pulse, normal toenails, normal skin. Current use of statin BP at goal Repeat hgbA1c, BMP and UACr Advised about proper foot care and need for shoes at all times  Chronic pain of left knee onset 6months ago, associated with swelling, radiates to calf region, Worse with walking and climbing stair Denies any injury Use of voltaren gel with no improvement  Limited knee flexion, crepitus, Joint effusion and increased warmth noted today, no erythema. She declined referral to sports medicine. Check BMP and uric acid today. Start naproxen  500mg  BIDprn and use knee sleeve daily  Reviewed medical, surgical, and social history today  Medications: Outpatient Medications Prior to Visit  Medication Sig   atorvastatin  (LIPITOR) 40 MG tablet Take 1 tablet (40 mg total) by mouth daily at 6 PM.   Multiple Vitamin (MULTIVITAMIN ADULT PO) Take by mouth.   [DISCONTINUED] meloxicam  (MOBIC ) 15 MG tablet Take 1 tablet (15 mg total) by mouth daily. (Patient not taking: Reported on 03/15/2024)   No facility-administered medications prior to visit.   Reviewed past medical and social history.   ROS per HPI above      Objective:  BP 132/78 (BP Location: Right Arm, Patient Position: Sitting, Cuff Size: Small)   Pulse 64   Temp 98.2 F (36.8 C)  (Oral)   Ht 5' 3 (1.6 m)   Wt 170 lb 6.4 oz (77.3 kg)   SpO2 98%   BMI 30.19 kg/m      Physical Exam Vitals and nursing note reviewed.  Cardiovascular:     Rate and Rhythm: Normal rate and regular rhythm.     Pulses: Normal pulses.     Heart sounds: Normal heart sounds.  Pulmonary:     Effort: Pulmonary effort is normal.     Breath sounds: Normal breath sounds.  Musculoskeletal:        General: Tenderness present.     Right upper leg: Normal.     Left upper leg: Normal.     Right knee: Crepitus present. No swelling, effusion or erythema. Normal range of motion.     Left knee: Swelling, effusion and crepitus present. No erythema. Decreased range of motion. Tenderness present over the medial joint line and lateral joint line.     Right lower leg: Normal. No edema.     Left lower leg: Normal. No edema.  Neurological:     Mental Status: She is alert and oriented to person, place, and time.     No results found for any visits on 03/15/24.    Assessment & Plan:    Problem List Items Addressed This Visit     Chronic pain of left knee   onset 6months ago, associated with swelling, radiates to calf region, Worse with walking and climbing stair Denies any  injury Use of voltaren gel with no improvement  Limited knee flexion, crepitus, Joint effusion and increased warmth noted today, no erythema. She declined referral to sports medicine. Check BMP and uric acid today. Start naproxen  500mg  BIDprn and use knee sleeve daily      Relevant Medications   naproxen  (NAPROSYN ) 500 MG tablet   Other Relevant Orders   Uric acid   Hyperlipidemia associated with type 2 diabetes mellitus (HCC)   No adverse effects with atorvastatin  Maintain med dose      Type 2 diabetes mellitus with hyperglycemia, without long-term current use of insulin (HCC) - Primary   Controlled with diet UTD with eye exam Report bilateral numbness in 5th toe, no ulcer, no swelling, no pain with ambulation or  elevation, normal pedal pulse, normal toenails, normal skin. Current use of statin BP at goal Repeat hgbA1c, BMP and UACr Advised about proper foot care and need for shoes at all times      Relevant Orders   Renal Function Panel   Microalbumin / creatinine urine ratio   Hemoglobin A1c   Return in about 6 months (around 09/15/2024) for DM, hyperlipidemia (fasting).     Roselie Mood, NP

## 2024-03-15 NOTE — Assessment & Plan Note (Signed)
 Controlled with diet UTD with eye exam Report bilateral numbness in 5th toe, no ulcer, no swelling, no pain with ambulation or elevation, normal pedal pulse, normal toenails, normal skin. Current use of statin BP at goal Repeat hgbA1c, BMP and UACr Advised about proper foot care and need for shoes at all times

## 2024-03-15 NOTE — Assessment & Plan Note (Signed)
 No adverse effects with atorvastatin Maintain med dose

## 2024-03-15 NOTE — Assessment & Plan Note (Signed)
 onset 6months ago, associated with swelling, radiates to calf region, Worse with walking and climbing stair Denies any injury Use of voltaren gel with no improvement  Limited knee flexion, crepitus, Joint effusion and increased warmth noted today, no erythema. She declined referral to sports medicine. Check BMP and uric acid today. Start naproxen  500mg  BIDprn and use knee sleeve daily

## 2024-03-15 NOTE — Patient Instructions (Addendum)
 Use a knee sleeve daily, off at bedtime (20.5inches lower thigh, 15.2inches upper calf) Use naproxen  500mg  every 12hrs as needed for knee pain, take with food. Call office if you change you mind about sports medicine referral. Call office if you develop any new symptoms: joint redness, unable to bear weight on left knee, fever, stiffness. Go to lab

## 2024-03-22 ENCOUNTER — Ambulatory Visit: Payer: Self-pay | Admitting: Nurse Practitioner

## 2024-03-22 DIAGNOSIS — E1165 Type 2 diabetes mellitus with hyperglycemia: Secondary | ICD-10-CM

## 2024-06-15 ENCOUNTER — Other Ambulatory Visit

## 2024-06-15 ENCOUNTER — Ambulatory Visit: Payer: Self-pay | Admitting: Nurse Practitioner

## 2024-06-15 DIAGNOSIS — E1165 Type 2 diabetes mellitus with hyperglycemia: Secondary | ICD-10-CM | POA: Diagnosis not present

## 2024-06-15 LAB — MICROALBUMIN / CREATININE URINE RATIO
Creatinine,U: 47.3 mg/dL
Microalb Creat Ratio: UNDETERMINED mg/g (ref 0.0–30.0)
Microalb, Ur: <0.7 mg/dL

## 2024-06-15 LAB — HEMOGLOBIN A1C: Hgb A1c MFr Bld: 5.8 % (ref 4.6–6.5)

## 2024-09-17 ENCOUNTER — Ambulatory Visit: Admitting: Nurse Practitioner

## 2024-09-18 ENCOUNTER — Ambulatory Visit: Admitting: Nurse Practitioner

## 2024-09-28 ENCOUNTER — Encounter: Payer: Self-pay | Admitting: Nurse Practitioner

## 2024-09-28 ENCOUNTER — Ambulatory Visit: Admitting: Nurse Practitioner

## 2024-09-28 VITALS — BP 128/74 | HR 74 | Temp 97.4°F | Ht 63.0 in | Wt 168.6 lb

## 2024-09-28 DIAGNOSIS — E1169 Type 2 diabetes mellitus with other specified complication: Secondary | ICD-10-CM

## 2024-09-28 DIAGNOSIS — E1165 Type 2 diabetes mellitus with hyperglycemia: Secondary | ICD-10-CM

## 2024-09-28 DIAGNOSIS — R202 Paresthesia of skin: Secondary | ICD-10-CM | POA: Insufficient documentation

## 2024-09-28 LAB — LIPID PANEL
Cholesterol: 129 mg/dL (ref 28–200)
HDL: 49.1 mg/dL
LDL Cholesterol: 65 mg/dL (ref 10–99)
NonHDL: 79.86
Total CHOL/HDL Ratio: 3
Triglycerides: 75 mg/dL (ref 10.0–149.0)
VLDL: 15 mg/dL (ref 0.0–40.0)

## 2024-09-28 LAB — VITAMIN B12: Vitamin B-12: 1004 pg/mL — ABNORMAL HIGH (ref 211–911)

## 2024-09-28 LAB — CBC
HCT: 42.4 % (ref 36.0–46.0)
Hemoglobin: 13.7 g/dL (ref 12.0–15.0)
MCHC: 32.3 g/dL (ref 30.0–36.0)
MCV: 85.8 fl (ref 78.0–100.0)
Platelets: 173 10*3/uL (ref 150.0–400.0)
RBC: 4.95 Mil/uL (ref 3.87–5.11)
RDW: 14 % (ref 11.5–15.5)
WBC: 6.5 10*3/uL (ref 4.0–10.5)

## 2024-09-28 LAB — COMPREHENSIVE METABOLIC PANEL WITH GFR
ALT: 19 U/L (ref 3–35)
AST: 24 U/L (ref 5–37)
Albumin: 4.4 g/dL (ref 3.5–5.2)
Alkaline Phosphatase: 66 U/L (ref 39–117)
BUN: 17 mg/dL (ref 6–23)
CO2: 25 meq/L (ref 19–32)
Calcium: 9.4 mg/dL (ref 8.4–10.5)
Chloride: 107 meq/L (ref 96–112)
Creatinine, Ser: 0.74 mg/dL (ref 0.40–1.20)
GFR: 77 mL/min
Glucose, Bld: 104 mg/dL — ABNORMAL HIGH (ref 70–99)
Potassium: 3.8 meq/L (ref 3.5–5.1)
Sodium: 141 meq/L (ref 135–145)
Total Bilirubin: 0.7 mg/dL (ref 0.2–1.2)
Total Protein: 7.4 g/dL (ref 6.0–8.3)

## 2024-09-28 LAB — HEMOGLOBIN A1C: Hgb A1c MFr Bld: 5.7 % (ref 4.6–6.5)

## 2024-09-28 LAB — TSH: TSH: 1.95 u[IU]/mL (ref 0.35–5.50)

## 2024-09-28 MED ORDER — ATORVASTATIN CALCIUM 40 MG PO TABS: 40.0000 mg | ORAL_TABLET | Freq: Every day | ORAL | 3 refills | Status: AC

## 2024-09-28 NOTE — Assessment & Plan Note (Signed)
 Chronic, worse at night, describes as tingling and burning sensation. Denies need for medication at this time  Normal foot exam today Check cbc, tsh, B12 today

## 2024-09-28 NOTE — Assessment & Plan Note (Signed)
 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

## 2024-09-28 NOTE — Assessment & Plan Note (Signed)
 No adverse effects with atorvastatin  Maintain med dose Repeat lipid panel and CMP

## 2024-09-28 NOTE — Progress Notes (Signed)
 "               Established Patient Visit  Patient: Heather Winters   DOB: [birth year]   79 y.o. Female  MRN: [id number] Visit Date: 09/28/2024  Subjective:    Chief Complaint  Patient presents with   Follow-up    FASTING 6 month follow up for DM, Hyperlipidemia DUE for Foot and Eye exam,    HPI Paresthesia of both feet Chronic, worse at night, describes as tingling and burning sensation. 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   ROS per HPI above      Objective:  BP 128/74 (BP Location: Left Arm, Patient Position: Sitting, Cuff Size: Large)   Pulse 74   Temp (!) 97.4 F (36.3 C) (Oral)   Ht 5' 3 (1.6 m)   Wt 168 lb 9.6 oz (76.5 kg)   SpO2 100%   BMI 29.87 kg/m      Physical Exam Vitals and nursing note reviewed.  Cardiovascular:     Rate and Rhythm: Normal rate and regular rhythm.     Pulses: Normal pulses.     Heart sounds: Normal heart sounds.  Pulmonary:     Effort: Pulmonary effort is normal.     Breath sounds: Normal breath sounds.  Musculoskeletal:     Right lower leg: No edema.     Left lower leg: No edema.   Skin:    General: Skin is warm and dry.  Neurological:     Mental Status: She is alert and oriented to person, place, and time.  Psychiatric:        Mood and Affect: Mood normal.        Behavior: Behavior normal.        Thought Content: Thought content normal.     No results found for any visits on 09/28/24.    Assessment & Plan:    Problem List Items Addressed This Visit     Hyperlipidemia associated with type 2 diabetes mellitus (HCC) - Primary   No adverse effects with atorvastatin  Maintain med dose Repeat lipid panel and CMP      Relevant Medications   atorvastatin  (LIPITOR) 40 MG tablet   Other Relevant Orders   Lipid panel   Comprehensive metabolic panel with GFR   Paresthesia of both feet   Chronic, worse at night, describes as tingling and burning sensation. 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     Roselie Mood, NP      [1]  Outpatient Medications Prior to Visit  Medication Sig   Multiple Vitamin (MULTIVITAMIN ADULT PO) Take by mouth.   [DISCONTINUED] atorvastatin  (LIPITOR) 40 MG tablet Take 1 tablet (40 mg total) by mouth daily at 6 PM.   [DISCONTINUED] naproxen  (NAPROSYN ) 500 MG tablet Take 1 tablet (500 mg total) by mouth 2 (two) times daily as needed.  With food (Patient not taking: Reported on 09/28/2024)   No facility-administered medications prior to visit.   "

## 2024-09-28 NOTE — Patient Instructions (Signed)
 Go to lab Maintain Heart healthy diet and daily exercise. Maintain current medications.  Nerve Pain (Neuropathic Pain): What to Know Nerve pain, also called neuropathic pain, happens when nerves in your body are damaged. This type of pain can make you feel more pain than usual. Even a small touch can hurt a lot. Nerve pain can last for a long time (be chronic) and can be hard to treat. The pain can be different for each person. It might: Start suddenly or slowly. Come and go as the damaged nerves heal, or it may stay the same for years. Cause stress, trouble sleeping, and make life harder. What are the causes? Many things can cause nerve damage, such as: Metabolic problems like: Diabetes. This is the most common cause. Lack of vitamins like B12. Medicines and chemicals. Nerve damage can happen from medicines that kill cancer cells (chemotherapy) or from drinking too much alcohol. Any injury that cuts, crushes, or stretches a nerve. Compression. If a nerve gets trapped or compressed for too long, the blood supply to the nerve can be cut off. Blood vessel disease. This can cause pain by decreasing blood supply and oxygen to nerves. Autoimmune diseases like lupus or multiple sclerosis. These are diseases where the body's defense system (immune system) attacks its own nerves. Infections with germs, also called viruses, such as shingles. Diseases that are passed down through families. What increases the risk? You're more likely to get nerve pain if: You have diabetes. You smoke. You drink too much alcohol. You take certain medicines, like those for cancer or immune system problems. What are the signs or symptoms? The main symptom is pain. Nerve pain is often described as: Burning. Shock-like. Stinging. Hot or cold. Itching. Tingling. Prickling. How is this diagnosed? No single test can diagnose nerve pain. It's diagnosed based on: A physical exam and your symptoms. Your health care  provider will ask you about your pain. You may be asked to use a pain scale to describe how bad your pain is. Tests to find nerve damage, like: Nerve conduction studies and EMG to check how well nerves and muscles work. Skin biopsy, which is when a small piece of skin is removed for testing. This test looks for a nerve problem called small fiber neuropathy. Imaging tests, such as: X-rays. CT scan. MRI. How is this treated? Treatment can change over time. You might need to try different treatments or a mix of them. Options include: Treating the cause such as managing diabetes or fixing vitamin levels. Stopping medicines that can cause nerve pain. Taking medicines to relieve pain. These may include: Pain medicines. Anti-seizure medicines. Antidepressant medicines. Pain-relieving patches or creams that are put on painful areas of skin. A medicine to numb the area, which can be injected as a nerve block. Transcutaneous nerve stimulation. This uses electrical currents to block painful nerve signals. The treatment is painless. Other treatments, such as: Acupuncture. Meditation. Massage. Occupational or physical therapy. Pain management programs. Counseling. Follow these instructions at home: Medicines  Take your medicines only as told. You may need to take steps to help treat or prevent trouble pooping (constipation), such as: Taking medicines to help you poop. Eating foods high in fiber, like beans, whole grains, and fresh fruits and vegetables. Drinking more fluids as told. Ask your provider if it's safe to drive or use machines while taking your medicine. Lifestyle  Have a good support system at home. Join a chronic pain support group. Consider talking with a mental health  care provider about how to cope with the pain. Do not smoke, vape, or use nicotine or tobacco. Do not drink alcohol. General instructions Learn as much as you can about your condition. Work closely with all  your providers to find the treatment plan that works best for you. Ask what things are safe for you to do at home. Exercise as told. Keep all follow-up visits. Your provider will check if the treatments are working and change them if needed. Contact a health care provider if: Your pain treatments aren't working. You're having side effects from your medicines. You feel very tired, sad, or anxious. Get help right away if: You feel like you may hurt yourself or others. You have thoughts about taking your own life. You have other thoughts or feelings that worry you. These symptoms may be an emergency. Take one of these steps right away: Go to your nearest emergency room. Call 911. Contact the Suicide & Crisis Lifeline (24/7, free and confidential): Call or text 988. Chat online at chat.newsactor.se. For Veterans and their loved ones: Call 988 and press 1. Text the [employer] at (819)656-8243. Chat online at reservationslist.si. This information is not intended to replace advice given to you by your health care provider. Make sure you discuss any questions you have with your health care provider. Document Revised: 10/26/2023 Document Reviewed: 10/26/2023 [employer] Patient Education  2025 [employer].

## 2024-10-12 ENCOUNTER — Ambulatory Visit: Payer: Medicare Other

## 2024-10-15 ENCOUNTER — Ambulatory Visit

## 2025-03-28 ENCOUNTER — Ambulatory Visit: Admitting: Nurse Practitioner
